# Patient Record
Sex: Female | Born: 1980 | Race: White | Hispanic: No | Smoking: Current every day smoker
Health system: Southern US, Community
[De-identification: ages and names within clinical notes are randomized; demographics above are authoritative.]

## PROBLEM LIST (undated history)

## (undated) DIAGNOSIS — F329 Major depressive disorder, single episode, unspecified: Secondary | ICD-10-CM

## (undated) DIAGNOSIS — R911 Solitary pulmonary nodule: Secondary | ICD-10-CM

## (undated) DIAGNOSIS — F1010 Alcohol abuse, uncomplicated: Secondary | ICD-10-CM

## (undated) DIAGNOSIS — F419 Anxiety disorder, unspecified: Secondary | ICD-10-CM

## (undated) DIAGNOSIS — F32A Depression, unspecified: Secondary | ICD-10-CM

## (undated) DIAGNOSIS — R569 Unspecified convulsions: Secondary | ICD-10-CM

## (undated) DIAGNOSIS — Z8614 Personal history of Methicillin resistant Staphylococcus aureus infection: Secondary | ICD-10-CM

## (undated) DIAGNOSIS — F988 Other specified behavioral and emotional disorders with onset usually occurring in childhood and adolescence: Secondary | ICD-10-CM

## (undated) HISTORY — PX: BUNIONECTOMY: SHX129

## (undated) HISTORY — DX: Unspecified convulsions: R56.9

## (undated) HISTORY — DX: Personal history of Methicillin resistant Staphylococcus aureus infection: Z86.14

## (undated) HISTORY — DX: Major depressive disorder, single episode, unspecified: F32.9

## (undated) HISTORY — DX: Anxiety disorder, unspecified: F41.9

## (undated) HISTORY — DX: Depression, unspecified: F32.A

## (undated) HISTORY — DX: Solitary pulmonary nodule: R91.1

## (undated) HISTORY — DX: Other specified behavioral and emotional disorders with onset usually occurring in childhood and adolescence: F98.8

---
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
## 2000-06-22 ENCOUNTER — Other Ambulatory Visit: Admission: RE | Admit: 2000-06-22 | Discharge: 2000-06-22 | Payer: Self-pay | Admitting: Obstetrics and Gynecology

## 2001-01-04 ENCOUNTER — Inpatient Hospital Stay (HOSPITAL_COMMUNITY): Admission: AD | Admit: 2001-01-04 | Discharge: 2001-01-07 | Payer: Self-pay | Admitting: Obstetrics and Gynecology

## 2001-01-20 ENCOUNTER — Encounter: Admission: RE | Admit: 2001-01-20 | Discharge: 2001-02-19 | Payer: Self-pay | Admitting: Obstetrics and Gynecology

## 2001-03-22 ENCOUNTER — Encounter: Admission: RE | Admit: 2001-03-22 | Discharge: 2001-04-21 | Payer: Self-pay | Admitting: Obstetrics and Gynecology

## 2001-04-22 ENCOUNTER — Encounter: Admission: RE | Admit: 2001-04-22 | Discharge: 2001-05-22 | Payer: Self-pay | Admitting: Obstetrics and Gynecology

## 2001-06-22 ENCOUNTER — Encounter: Admission: RE | Admit: 2001-06-22 | Discharge: 2001-07-22 | Payer: Self-pay | Admitting: Obstetrics and Gynecology

## 2001-07-23 ENCOUNTER — Encounter: Admission: RE | Admit: 2001-07-23 | Discharge: 2001-08-22 | Payer: Self-pay | Admitting: Obstetrics and Gynecology

## 2001-09-22 ENCOUNTER — Encounter: Admission: RE | Admit: 2001-09-22 | Discharge: 2001-10-22 | Payer: Self-pay | Admitting: Obstetrics and Gynecology

## 2001-11-22 ENCOUNTER — Encounter: Admission: RE | Admit: 2001-11-22 | Discharge: 2001-12-22 | Payer: Self-pay | Admitting: Obstetrics and Gynecology

## 2001-12-23 ENCOUNTER — Encounter: Admission: RE | Admit: 2001-12-23 | Discharge: 2002-01-22 | Payer: Self-pay | Admitting: Obstetrics and Gynecology

## 2002-03-23 ENCOUNTER — Encounter: Admission: RE | Admit: 2002-03-23 | Discharge: 2002-03-23 | Payer: Self-pay | Admitting: *Deleted

## 2002-12-02 ENCOUNTER — Emergency Department (HOSPITAL_COMMUNITY): Admission: EM | Admit: 2002-12-02 | Discharge: 2002-12-02 | Payer: Self-pay | Admitting: Emergency Medicine

## 2002-12-02 ENCOUNTER — Encounter: Payer: Self-pay | Admitting: Emergency Medicine

## 2003-01-19 ENCOUNTER — Encounter: Admission: RE | Admit: 2003-01-19 | Discharge: 2003-01-19 | Payer: Self-pay | Admitting: *Deleted

## 2003-12-28 ENCOUNTER — Emergency Department (HOSPITAL_COMMUNITY): Admission: EM | Admit: 2003-12-28 | Discharge: 2003-12-28 | Payer: Self-pay | Admitting: *Deleted

## 2004-01-24 ENCOUNTER — Emergency Department (HOSPITAL_COMMUNITY): Admission: EM | Admit: 2004-01-24 | Discharge: 2004-01-24 | Payer: Self-pay | Admitting: Emergency Medicine

## 2004-06-21 ENCOUNTER — Emergency Department (HOSPITAL_COMMUNITY): Admission: EM | Admit: 2004-06-21 | Discharge: 2004-06-21 | Payer: Self-pay | Admitting: Emergency Medicine

## 2004-09-21 ENCOUNTER — Emergency Department (HOSPITAL_COMMUNITY): Admission: EM | Admit: 2004-09-21 | Discharge: 2004-09-21 | Payer: Self-pay | Admitting: Emergency Medicine

## 2004-10-26 DIAGNOSIS — O009 Unspecified ectopic pregnancy without intrauterine pregnancy: Secondary | ICD-10-CM

## 2004-10-26 HISTORY — DX: Unspecified ectopic pregnancy without intrauterine pregnancy: O00.90

## 2004-11-07 ENCOUNTER — Other Ambulatory Visit: Admission: RE | Admit: 2004-11-07 | Discharge: 2004-11-07 | Payer: Self-pay | Admitting: Obstetrics and Gynecology

## 2004-11-10 ENCOUNTER — Other Ambulatory Visit: Admission: RE | Admit: 2004-11-10 | Discharge: 2004-11-10 | Payer: Self-pay | Admitting: Obstetrics and Gynecology

## 2005-05-09 ENCOUNTER — Emergency Department (HOSPITAL_COMMUNITY): Admission: EM | Admit: 2005-05-09 | Discharge: 2005-05-09 | Payer: Self-pay | Admitting: Emergency Medicine

## 2005-05-27 ENCOUNTER — Emergency Department (HOSPITAL_COMMUNITY): Admission: EM | Admit: 2005-05-27 | Discharge: 2005-05-27 | Payer: Self-pay | Admitting: Emergency Medicine

## 2006-01-24 ENCOUNTER — Emergency Department (HOSPITAL_COMMUNITY): Admission: EM | Admit: 2006-01-24 | Discharge: 2006-01-24 | Payer: Self-pay | Admitting: Emergency Medicine

## 2006-01-31 ENCOUNTER — Inpatient Hospital Stay (HOSPITAL_COMMUNITY): Admission: AD | Admit: 2006-01-31 | Discharge: 2006-01-31 | Payer: Self-pay | Admitting: Obstetrics and Gynecology

## 2006-02-05 ENCOUNTER — Inpatient Hospital Stay (HOSPITAL_COMMUNITY): Admission: AD | Admit: 2006-02-05 | Discharge: 2006-02-05 | Payer: Self-pay | Admitting: Obstetrics and Gynecology

## 2006-02-08 ENCOUNTER — Inpatient Hospital Stay (HOSPITAL_COMMUNITY): Admission: AD | Admit: 2006-02-08 | Discharge: 2006-02-08 | Payer: Self-pay | Admitting: Family Medicine

## 2006-02-24 ENCOUNTER — Encounter: Payer: Self-pay | Admitting: Emergency Medicine

## 2006-03-26 ENCOUNTER — Emergency Department (HOSPITAL_COMMUNITY): Admission: EM | Admit: 2006-03-26 | Discharge: 2006-03-26 | Payer: Self-pay | Admitting: Emergency Medicine

## 2007-02-06 ENCOUNTER — Inpatient Hospital Stay (HOSPITAL_COMMUNITY): Admission: AD | Admit: 2007-02-06 | Discharge: 2007-02-07 | Payer: Self-pay | Admitting: Obstetrics & Gynecology

## 2007-03-01 ENCOUNTER — Inpatient Hospital Stay (HOSPITAL_COMMUNITY): Admission: AD | Admit: 2007-03-01 | Discharge: 2007-03-04 | Payer: Self-pay | Admitting: Obstetrics and Gynecology

## 2007-03-05 ENCOUNTER — Encounter: Admission: RE | Admit: 2007-03-05 | Discharge: 2007-03-31 | Payer: Self-pay | Admitting: Obstetrics and Gynecology

## 2009-09-24 ENCOUNTER — Inpatient Hospital Stay (HOSPITAL_COMMUNITY): Admission: AD | Admit: 2009-09-24 | Discharge: 2009-09-24 | Payer: Self-pay | Admitting: Obstetrics and Gynecology

## 2009-10-06 ENCOUNTER — Inpatient Hospital Stay (HOSPITAL_COMMUNITY): Admission: AD | Admit: 2009-10-06 | Discharge: 2009-10-07 | Payer: Self-pay | Admitting: Obstetrics & Gynecology

## 2009-10-22 ENCOUNTER — Inpatient Hospital Stay (HOSPITAL_COMMUNITY): Admission: AD | Admit: 2009-10-22 | Discharge: 2009-10-22 | Payer: Self-pay | Admitting: Obstetrics and Gynecology

## 2009-11-18 ENCOUNTER — Encounter (INDEPENDENT_AMBULATORY_CARE_PROVIDER_SITE_OTHER): Payer: Self-pay | Admitting: Obstetrics and Gynecology

## 2009-11-18 ENCOUNTER — Ambulatory Visit (HOSPITAL_COMMUNITY): Admission: RE | Admit: 2009-11-18 | Discharge: 2009-11-18 | Payer: Self-pay | Admitting: Obstetrics and Gynecology

## 2009-11-18 ENCOUNTER — Ambulatory Visit: Payer: Self-pay | Admitting: Vascular Surgery

## 2009-11-24 ENCOUNTER — Inpatient Hospital Stay (HOSPITAL_COMMUNITY): Admission: AD | Admit: 2009-11-24 | Discharge: 2009-11-26 | Payer: Self-pay | Admitting: Obstetrics and Gynecology

## 2009-11-27 ENCOUNTER — Encounter: Admission: RE | Admit: 2009-11-27 | Discharge: 2009-12-24 | Payer: Self-pay | Admitting: Obstetrics and Gynecology

## 2009-12-25 ENCOUNTER — Encounter: Admission: RE | Admit: 2009-12-25 | Discharge: 2010-01-24 | Payer: Self-pay | Admitting: Obstetrics and Gynecology

## 2010-01-25 ENCOUNTER — Encounter: Admission: RE | Admit: 2010-01-25 | Discharge: 2010-02-24 | Payer: Self-pay | Admitting: Obstetrics and Gynecology

## 2010-02-25 ENCOUNTER — Encounter: Admission: RE | Admit: 2010-02-25 | Discharge: 2010-03-27 | Payer: Self-pay | Admitting: Obstetrics and Gynecology

## 2010-03-28 ENCOUNTER — Encounter: Admission: RE | Admit: 2010-03-28 | Discharge: 2010-04-27 | Payer: Self-pay | Admitting: Obstetrics and Gynecology

## 2010-04-28 ENCOUNTER — Encounter: Admission: RE | Admit: 2010-04-28 | Discharge: 2010-05-28 | Payer: Self-pay | Admitting: Obstetrics and Gynecology

## 2010-05-29 ENCOUNTER — Encounter: Admission: RE | Admit: 2010-05-29 | Discharge: 2010-06-28 | Payer: Self-pay | Admitting: Obstetrics and Gynecology

## 2010-06-29 ENCOUNTER — Encounter: Admission: RE | Admit: 2010-06-29 | Discharge: 2010-07-22 | Payer: Self-pay | Admitting: Obstetrics and Gynecology

## 2011-01-11 LAB — SYPHILIS: RPR W/REFLEX TO RPR TITER AND TREPONEMAL ANTIBODIES, TRADITIONAL SCREENING AND DIAGNOSIS ALGORITHM: RPR Ser Ql: NONREACTIVE

## 2011-01-11 LAB — CBC
HCT: 33.9 % — ABNORMAL LOW (ref 36.0–46.0)
MCHC: 33.5 g/dL (ref 30.0–36.0)
RBC: 3.33 MIL/uL — ABNORMAL LOW (ref 3.87–5.11)
RDW: 14.5 % (ref 11.5–15.5)
WBC: 10.3 10*3/uL (ref 4.0–10.5)

## 2011-01-26 LAB — COMPREHENSIVE METABOLIC PANEL
Alkaline Phosphatase: 211 U/L — ABNORMAL HIGH (ref 39–117)
Chloride: 106 mEq/L (ref 96–112)
GFR calc Af Amer: >60 mL/min (ref >60)
GFR calc non Af Amer: >60 mL/min (ref >60)
Potassium: 3.8 mEq/L (ref 3.5–5.1)
Total Bilirubin: 0.1 mg/dL — ABNORMAL LOW (ref 0.3–1.2)

## 2011-01-26 LAB — URINALYSIS, ROUTINE W REFLEX MICROSCOPIC
Protein, ur: NEGATIVE mg/dL
Specific Gravity, Urine: <1.005 — ABNORMAL LOW (ref 1.005–1.030)
Urobilinogen, UA: 0.2 mg/dL (ref 0.0–1.0)
pH: 6.5 (ref 5.0–8.0)

## 2011-01-26 LAB — CBC
MCV: 95 fL (ref 78.0–100.0)
Platelets: 330 10*3/uL (ref 150–400)
RBC: 3.52 MIL/uL — ABNORMAL LOW (ref 3.87–5.11)
RDW: 12.6 % (ref 11.5–15.5)
WBC: 11.9 10*3/uL — ABNORMAL HIGH (ref 4.0–10.5)

## 2011-01-27 LAB — URINALYSIS, ROUTINE W REFLEX MICROSCOPIC
Glucose, UA: NEGATIVE mg/dL
Hgb urine dipstick: NEGATIVE
Urobilinogen, UA: 0.2 mg/dL (ref 0.0–1.0)
pH: 5.5 (ref 5.0–8.0)

## 2011-01-27 LAB — FETAL FIBRONECTIN: Fetal Fibronectin: NEGATIVE

## 2011-01-27 LAB — URINE MICROSCOPIC-ADD ON: RBC / HPF: NONE SEEN RBC/hpf (ref <3)

## 2011-01-28 LAB — WET PREP, GENITAL: Trich, Wet Prep: NONE SEEN

## 2012-08-16 ENCOUNTER — Other Ambulatory Visit (HOSPITAL_COMMUNITY): Payer: Self-pay | Admitting: Lactation Services

## 2012-08-16 ENCOUNTER — Other Ambulatory Visit (HOSPITAL_COMMUNITY): Payer: Self-pay | Admitting: Family Medicine

## 2012-08-16 ENCOUNTER — Other Ambulatory Visit (HOSPITAL_COMMUNITY): Payer: Self-pay

## 2012-08-16 ENCOUNTER — Ambulatory Visit (HOSPITAL_COMMUNITY)
Admission: RE | Admit: 2012-08-16 | Discharge: 2012-08-16 | Disposition: A | Discharge status: Home / Self Care | Payer: BC Managed Care – PPO | Source: Ambulatory Visit | Attending: Family Medicine | Admitting: Family Medicine

## 2012-08-16 DIAGNOSIS — R1032 Left lower quadrant pain: Secondary | ICD-10-CM | POA: Insufficient documentation

## 2012-08-16 DIAGNOSIS — R102 Pelvic and perineal pain: Secondary | ICD-10-CM

## 2012-08-16 DIAGNOSIS — R110 Nausea: Secondary | ICD-10-CM

## 2012-08-16 DIAGNOSIS — O209 Hemorrhage in early pregnancy, unspecified: Secondary | ICD-10-CM | POA: Insufficient documentation

## 2012-08-22 ENCOUNTER — Ambulatory Visit
Admission: RE | Admit: 2012-08-22 | Discharge: 2012-08-22 | Disposition: A | Discharge status: Home / Self Care | Payer: BC Managed Care – PPO | Source: Ambulatory Visit | Attending: Family Medicine | Admitting: Family Medicine

## 2012-08-22 ENCOUNTER — Other Ambulatory Visit: Payer: Self-pay | Admitting: Family Medicine

## 2012-08-22 DIAGNOSIS — R1032 Left lower quadrant pain: Secondary | ICD-10-CM

## 2012-08-22 MED ORDER — IOHEXOL 300 MG/ML  SOLN
100.0000 mL | Freq: Once | INTRAMUSCULAR | Status: AC | PRN
  Administered 2012-08-22: 100 mL via INTRAVENOUS

## 2012-09-12 ENCOUNTER — Other Ambulatory Visit: Payer: Self-pay | Admitting: Family Medicine

## 2012-09-12 DIAGNOSIS — R911 Solitary pulmonary nodule: Secondary | ICD-10-CM

## 2012-09-15 ENCOUNTER — Ambulatory Visit
Admission: RE | Admit: 2012-09-15 | Discharge: 2012-09-15 | Disposition: A | Discharge status: Home / Self Care | Payer: BC Managed Care – PPO | Source: Ambulatory Visit | Attending: Family Medicine | Admitting: Family Medicine

## 2012-09-15 DIAGNOSIS — R911 Solitary pulmonary nodule: Secondary | ICD-10-CM

## 2012-09-19 DIAGNOSIS — R911 Solitary pulmonary nodule: Secondary | ICD-10-CM | POA: Insufficient documentation

## 2012-09-19 DIAGNOSIS — F988 Other specified behavioral and emotional disorders with onset usually occurring in childhood and adolescence: Secondary | ICD-10-CM | POA: Insufficient documentation

## 2012-09-19 DIAGNOSIS — F419 Anxiety disorder, unspecified: Secondary | ICD-10-CM

## 2012-09-19 DIAGNOSIS — F329 Major depressive disorder, single episode, unspecified: Secondary | ICD-10-CM | POA: Insufficient documentation

## 2012-09-19 DIAGNOSIS — F32A Depression, unspecified: Secondary | ICD-10-CM | POA: Insufficient documentation

## 2012-09-20 ENCOUNTER — Institutional Professional Consult (permissible substitution) (INDEPENDENT_AMBULATORY_CARE_PROVIDER_SITE_OTHER): Payer: BC Managed Care – PPO | Admitting: Thoracic Surgery (Cardiothoracic Vascular Surgery)

## 2012-09-20 ENCOUNTER — Encounter: Payer: Self-pay | Admitting: Thoracic Surgery (Cardiothoracic Vascular Surgery)

## 2012-09-20 VITALS — BP 130/80 | HR 102 | Resp 18 | Ht 64.0 in | Wt 135.0 lb

## 2012-09-20 DIAGNOSIS — R911 Solitary pulmonary nodule: Secondary | ICD-10-CM

## 2012-09-20 NOTE — Progress Notes (Signed)
PCP is Laurell Josephs, [place] Referring Provider is Carilyn Goodpasture, [place]  Chief Complaint  Patient presents with  . Lung Lesion    Surgical eval on Pulmonary nodules Chest CT Super D 09/16/12    HPI: 31 year old presents with chief complaint of a lung nodule  Mrs. Connie Reed is a 31 year old woman who was being evaluated for abdominal pain in October. A CT of the abdomen and pelvis revealed a small right middle lobe nodule. This was followed by a CT of the chest on November 21 which showed a 5 mm nodule in the right middle lobe and multiple other scattered subcentimeter nodules throughout both lungs. There was no hilar or mediastinal adenopathy.  She does continue to have some flank pain. She also says she periodically has pressure in her chest that she thinks is do to anxiety or panic attacks. She does not have a cough, hemoptysis, wheezing, or shortness of breath. She says she does feel tired all the time intravenous that to having 3 children. She does not have any known exposures to TB or fungus. She's not had any exposure to birds. She did travel to [place] in October.   Past Medical History  Diagnosis Date  . Pulmonary nodule   . ADD (attention deficit disorder)   . Anxiety and depression   . Hx MRSA infection     Past Surgical History  Procedure Date  . Bunionectomy     2009    Family History  Problem Relation Age of Onset  . Hypertension Mother   . Hyperlipidemia Father   . Hyperlipidemia Sister   . Hypertension Sister   . Diabetes Sister   . Kidney disease Maternal Grandmother   . Stroke Maternal Grandmother   . Breast cancer Maternal Grandmother   . Heart disease Maternal Grandfather   . Hyperlipidemia Maternal Grandfather   . Hypertension Maternal Grandfather     Social History History  Substance Use Topics  . Smoking status: Current Every Day Smoker -- 0.5 packs/day for 10 years    Types: Cigarettes  . Smokeless tobacco: Not on file  . Alcohol Use: No     Current Outpatient Prescriptions  Medication Sig Dispense Refill  . acetaminophen (TYLENOL) 500 MG tablet Take 500 mg by mouth every 6 (six) hours as needed.      . ALPRAZolam (XANAX) 1 MG tablet Take 1 mg by mouth at bedtime as needed.      Marland Kitchen amphetamine-dextroamphetamine (ADDERALL) 30 MG tablet Take 30 mg by mouth daily.      Marland Kitchen HYDROcodone-acetaminophen (NORCO/VICODIN) 5-325 MG per tablet Take 1 tablet by mouth every 6 (six) hours as needed.      Marland Kitchen ibuprofen (ADVIL,MOTRIN) 200 MG tablet Take 200 mg by mouth every 6 (six) hours as needed.      . ondansetron (ZOFRAN) 8 MG tablet Take by mouth every 8 (eight) hours as needed.        Allergies  Allergen Reactions  . Doxycycline Hyclate Other (See Comments)    Feels strange  . Phenergan (Promethazine Hcl) Other (See Comments)    Does not work     Review of Systems  Constitutional: Positive for appetite change and unexpected weight change.  Respiratory: Positive for chest tightness.   Cardiovascular: Positive for leg swelling.  Gastrointestinal:       Abdominal and flank pain  Genitourinary:       ? Kidney stone  Neurological: Positive for numbness.  Psychiatric/Behavioral:       Anxiety,  nervousness  All other systems reviewed and are negative.    BP 130/80  Pulse 102  Resp 18  Ht 5\' 4"  (1.626 m)  Wt 135 lb (61.236 kg)  BMI 23.17 kg/m2  SpO2 99%  LMP 09/13/2012 Physical Exam  Constitutional: She is oriented to person, place, and time. She appears well-developed and well-nourished. No distress.  HENT:  Head: Normocephalic and atraumatic.  Eyes: EOM are normal. Pupils are equal, round, and reactive to light.  Neck: Neck supple. No thyromegaly present.  Cardiovascular: Normal rate, regular rhythm, normal heart sounds and intact distal pulses.  Exam reveals no gallop and no friction rub.   No murmur heard. Pulmonary/Chest: Breath sounds normal. She has no wheezes. She has no rales.  Abdominal: Soft. There is no  tenderness.  Musculoskeletal: She exhibits no edema.  Lymphadenopathy:    She has no cervical adenopathy.  Neurological: She is alert and oriented to person, place, and time. No cranial nerve deficit.  Skin: Skin is dry.  Psychiatric: She has a normal mood and affect.     Diagnostic Tests: CT of chest 09/15/2012 CT CHEST WITHOUT CONTRAST  Technique: Multidetector CT imaging of the chest was performed  following the standard protocol without IV contrast.  Comparison: Abdominal CT scan 08/22/2012.  Findings: The chest wall is unremarkable. No breast masses,  supraclavicular or axillary adenopathy. Small scattered lymph  nodes are noted.  The heart is normal in size. No mediastinal or hilar  lymphadenopathy. There is residual thymic tissue noted in the  anterior mediastinum. The esophagus is grossly normal. The aorta  is normal in caliber.  Examination of the lung parenchyma demonstrates small scattered  pulmonary nodules. There are sub 4 mm nodules in both lungs and a  a smoothly marginated well-circumscribed 5 mm nodule in the right  middle lobe. No worrisome mass lesions and no acute pulmonary  findings. No findings of bronchiectasis or interstitial lung  disease. No pleural effusion.  The upper abdomen is unremarkable.  IMPRESSION:  1. Tiny scattered pulmonary nodules and a 5 mm nodule in the right  middle lobe. These are likely benign given their size, appearance  and the patient's age. I would recommend a follow-up noncontrast  chest CT in 6 months to document stability.  2. No acute pulmonary findings.  3. No mediastinal or hilar lymphadenopathy.   Impression: 31 year old woman with multiple lung nodules, the largest being a 5 mm nodule in the right middle lobe. These are most likely benign, incidental findings.   I reviewed the CT scan with the patient and her husband. We discussed at length the issues regarding these nodules. Although she is low risk for lung cancer, I  did counsel her to quit smoking. She did travel to [place] in October which is an area relatively endemic for histoplasmosis, although I doubt that is the etiology of the nodules.  My recommendation is to do a repeat CT in 6 months. This is in agreement with the Fleischner criteria. I think we can safely follow this with low-dose CT to minimize her radiation exposure.  Plan: Return in 6 months with low dose CT scan of chest to followup lung nodules.

## 2013-03-06 ENCOUNTER — Other Ambulatory Visit: Payer: Self-pay

## 2013-03-06 DIAGNOSIS — D381 Neoplasm of uncertain behavior of trachea, bronchus and lung: Secondary | ICD-10-CM

## 2013-03-28 ENCOUNTER — Inpatient Hospital Stay: Admission: RE | Admit: 2013-03-28 | Payer: BC Managed Care – PPO | Source: Ambulatory Visit

## 2013-03-28 ENCOUNTER — Ambulatory Visit: Payer: BC Managed Care – PPO | Admitting: Thoracic Surgery (Cardiothoracic Vascular Surgery)

## 2013-12-19 IMAGING — CT CT CHEST W/O CM
3 of 4 series · 16 of 30 positions shown, 17 images · non-contrast
Comparison: Abdominal CT scan 08/22/2012.

CLINICAL DATA: Pulmonary nodules seen on recent abdominal CT scan.

CT CHEST WITHOUT CONTRAST
TECHNIQUE: Multidetector CT imaging of the chest was performed
following the standard protocol without IV contrast.

[Series 3: chest w/o · axial · non-contrast · 0.78mm/px · z∈[-222,-42]mm · 4 of 61 slices shown, 5 images]
[im 13/61  mediastinal]
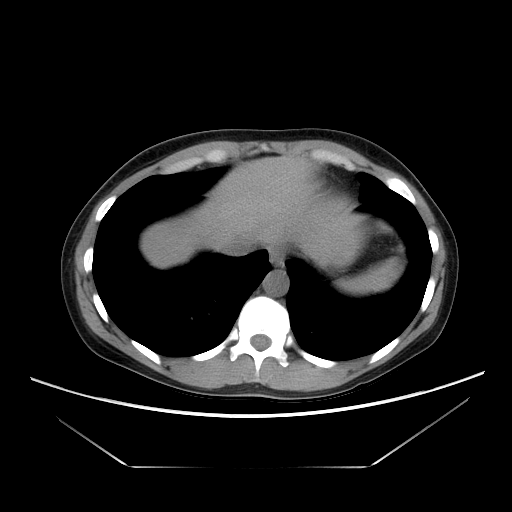
[im 13/61  lung]
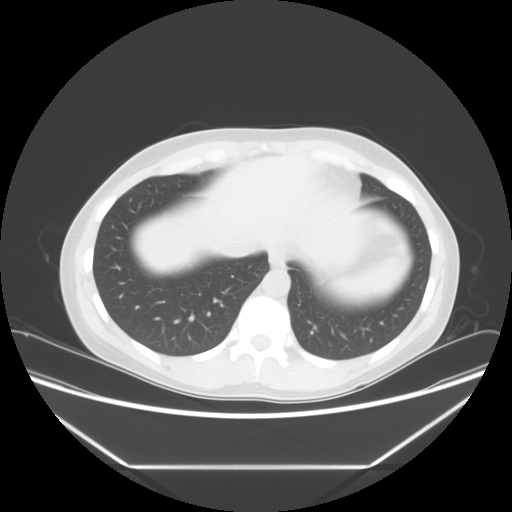
[im 25/61  lung]
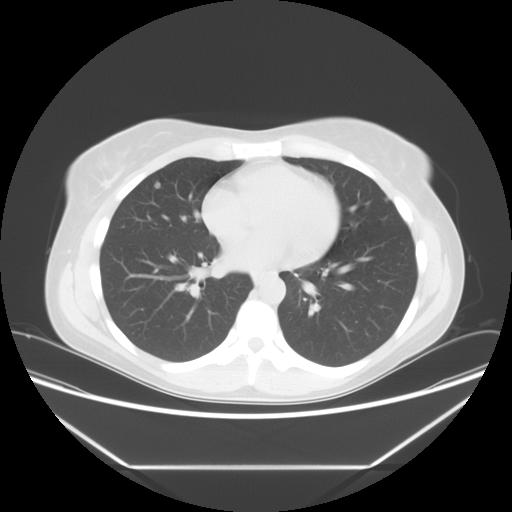
[im 37/61  lung]
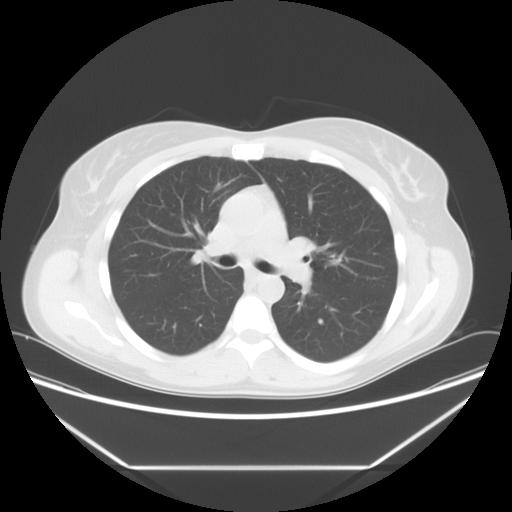
[im 49/61  lung]
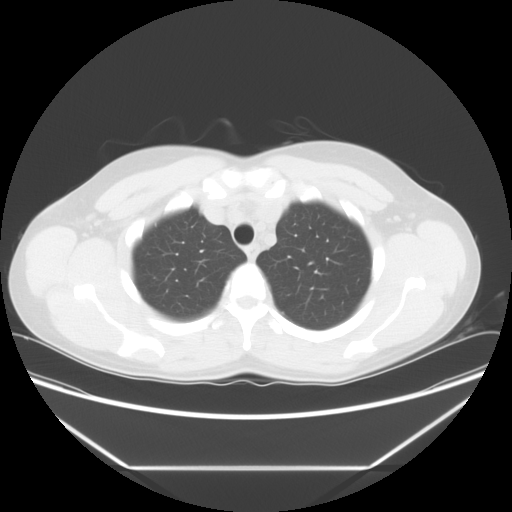

[Series 4: lung windows · axial · 0.78mm/px · z∈[-217,-42]mm · 4 of 59 slices shown]
[im 12/59  lung]
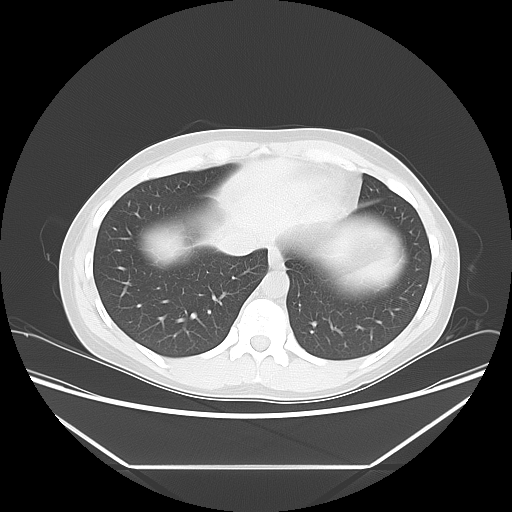
[im 24/59  lung]
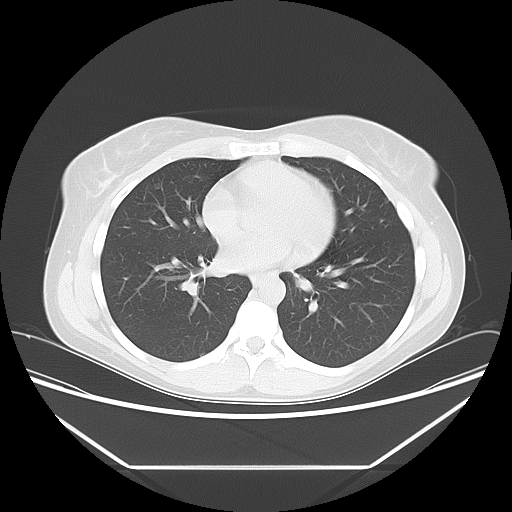
[im 35/59  lung]
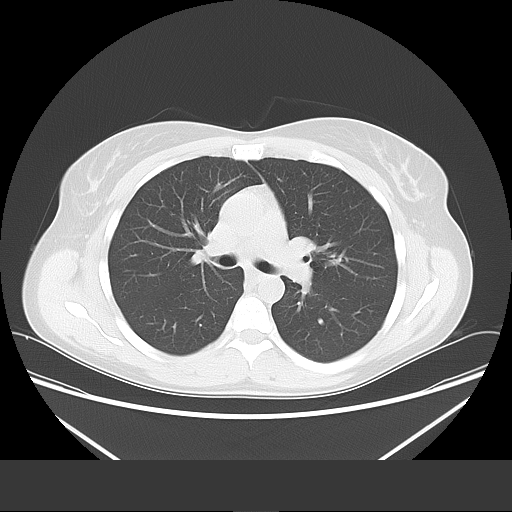
[im 47/59  lung]
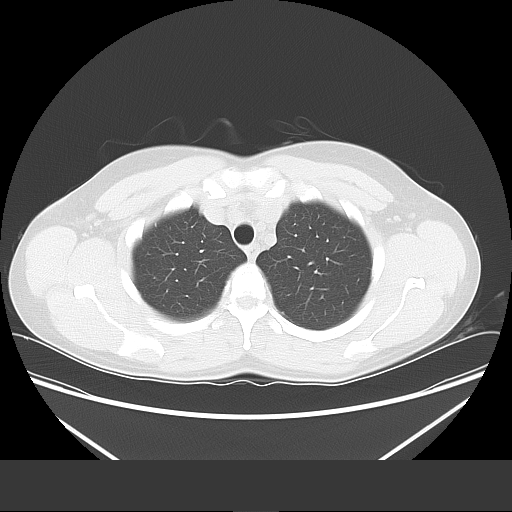

[Series 602: sagittal body · sagittal · 0.78mm/px · 8 of 155 slices shown]
[im 11/155  mediastinal]
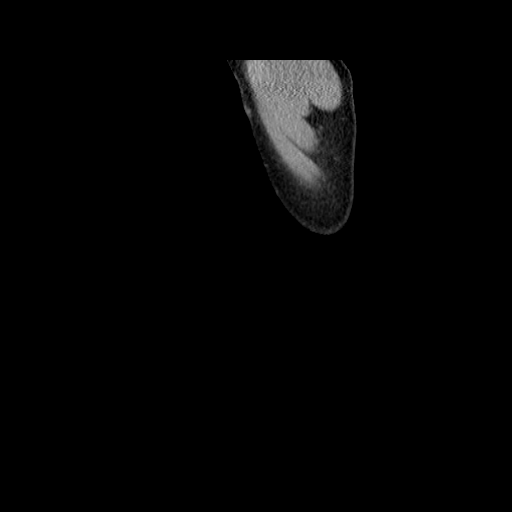
[im 31/155  mediastinal]
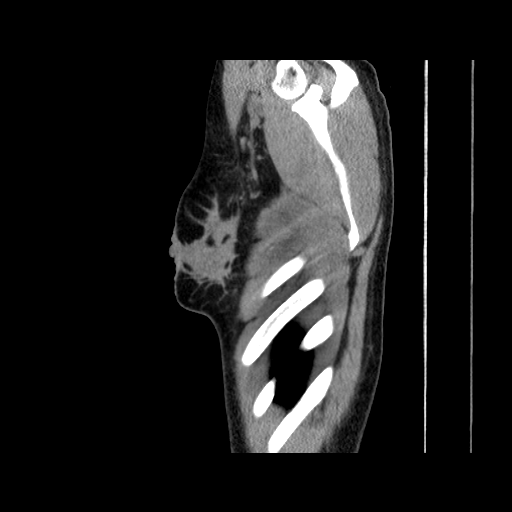
[im 52/155  mediastinal]
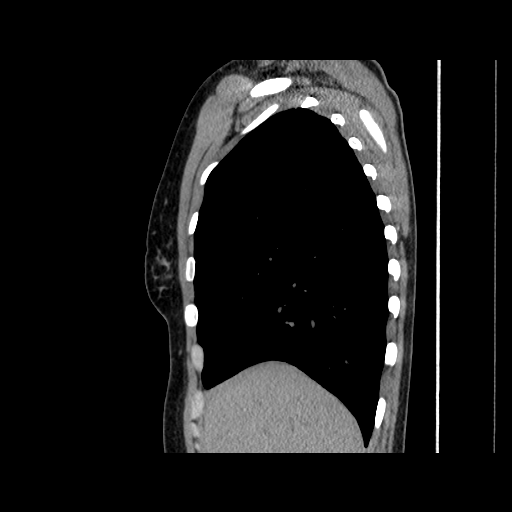
[im 72/155  mediastinal]
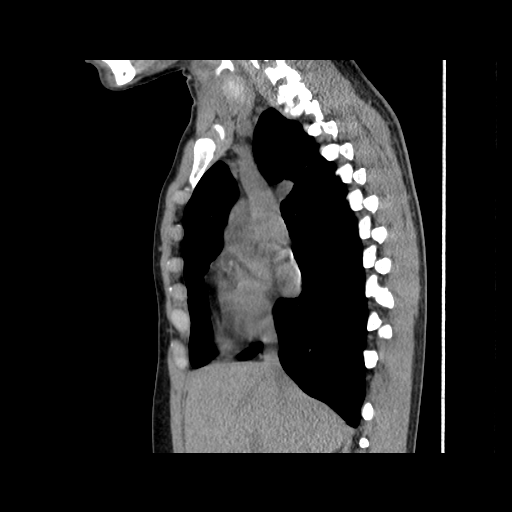
[im 83/155  mediastinal]
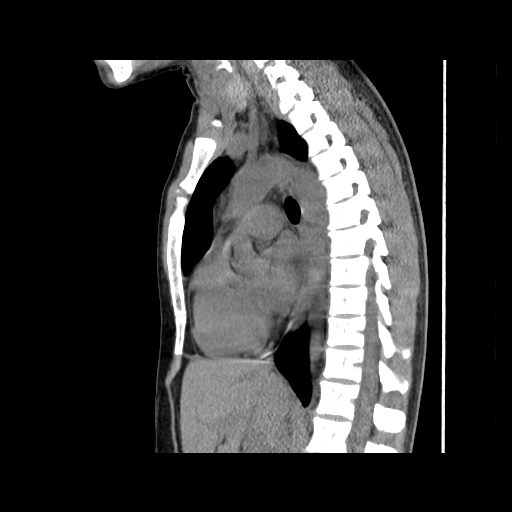
[im 103/155  mediastinal]
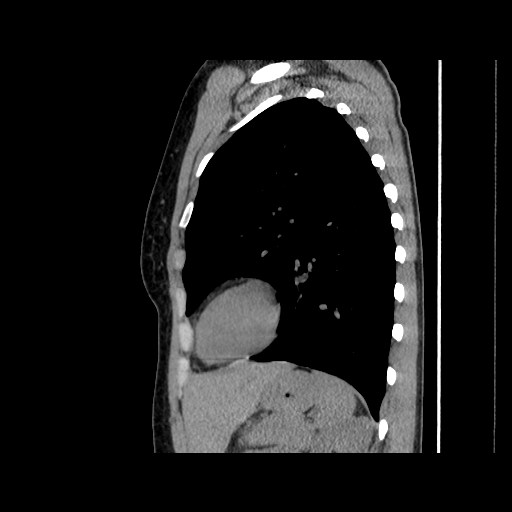
[im 124/155  mediastinal]
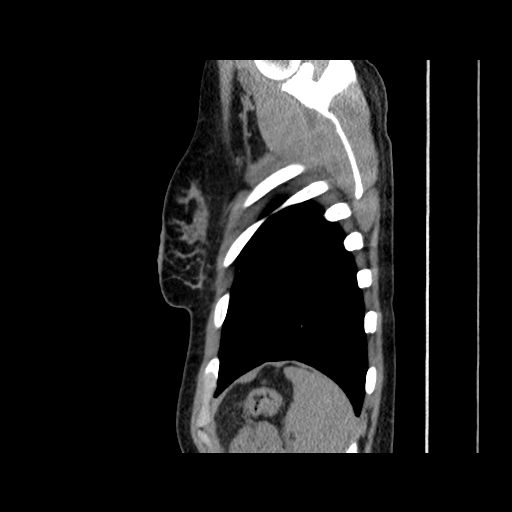
[im 144/155  mediastinal]
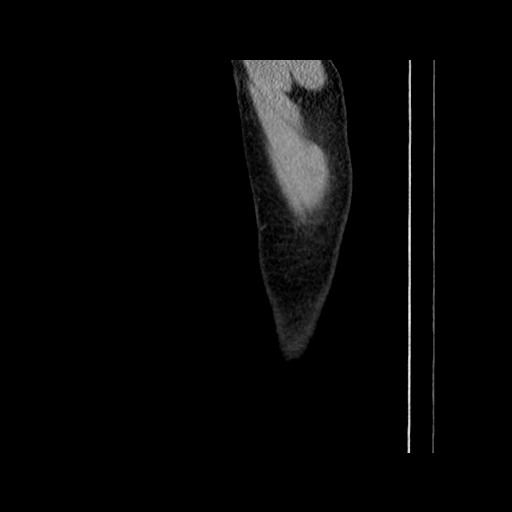

[16 of 30 positions shown; findings below may reference images not displayed]

FINDINGS: The chest wall is unremarkable.  No breast masses,
supraclavicular or axillary adenopathy.  Small scattered lymph
nodes are noted.

The heart is normal in size.  No mediastinal or hilar
lymphadenopathy.  There is residual thymic tissue noted in the
anterior mediastinum.  The esophagus is grossly normal.  The aorta
is normal in caliber.

Examination of the lung parenchyma demonstrates small scattered
pulmonary nodules.  There are sub 4 mm nodules in both lungs and a
a smoothly marginated well-circumscribed 5 mm nodule in the right
middle lobe.  No worrisome mass lesions and no acute pulmonary
findings.  No findings of bronchiectasis or interstitial lung
disease.  No pleural effusion.

The upper abdomen is unremarkable.
IMPRESSION: 1.  Tiny scattered pulmonary nodules and a 5 mm nodule in the right
middle lobe.  These are likely benign given their size, appearance
and the patient's age.  I would recommend a follow-up noncontrast
chest CT in 6 months to document stability.
2.  No acute pulmonary findings.
3.  No mediastinal or hilar lymphadenopathy.

## 2014-07-13 ENCOUNTER — Ambulatory Visit (INDEPENDENT_AMBULATORY_CARE_PROVIDER_SITE_OTHER): Payer: BC Managed Care – PPO | Admitting: Nurse Practitioner

## 2014-07-13 ENCOUNTER — Encounter: Payer: Self-pay | Admitting: Nurse Practitioner

## 2014-07-13 VITALS — BP 108/64 | HR 80 | Ht 64.0 in | Wt 147.0 lb

## 2014-07-13 DIAGNOSIS — N912 Amenorrhea, unspecified: Secondary | ICD-10-CM

## 2014-07-13 DIAGNOSIS — N9489 Other specified conditions associated with female genital organs and menstrual cycle: Secondary | ICD-10-CM

## 2014-07-13 DIAGNOSIS — Z01419 Encounter for gynecological examination (general) (routine) without abnormal findings: Secondary | ICD-10-CM

## 2014-07-13 DIAGNOSIS — Z0000 Encounter for general adult medical examination without abnormal findings: Secondary | ICD-10-CM

## 2014-07-13 LAB — POCT URINALYSIS DIPSTICK
Bilirubin, UA: NEGATIVE
Glucose, UA: NEGATIVE
KETONES UA: NEGATIVE
Leukocytes, UA: NEGATIVE
Nitrite, UA: NEGATIVE
PROTEIN UA: NEGATIVE
RBC UA: NEGATIVE
UROBILINOGEN UA: NEGATIVE
pH, UA: 5

## 2014-07-13 LAB — HEMOGLOBIN, FINGERSTICK: HEMOGLOBIN, FINGERSTICK: 13.5 g/dL (ref 12.0–16.0)

## 2014-07-13 LAB — POCT URINE PREGNANCY: PREG TEST UR: NEGATIVE

## 2014-07-13 MED ORDER — DESOGEST-ETH ESTRAD TRIPHASIC 0.1/0.125/0.15 -0.025 MG PO TABS: 1.0000 | ORAL_TABLET | Freq: Every day | ORAL | Status: DC

## 2014-07-13 NOTE — Patient Instructions (Signed)

## 2014-07-13 NOTE — Progress Notes (Signed)
Patient ID: Connie Reed, female   DOB: May 09, 1981, 33 y.o.   MRN: [id number] 33 y.o. [id number] Married Caucasian Fe here for annual exam.  Menses normal is 4-5 days. regular on OCP.  Then in August while on the pill took a home UPT and was positive.  2 days later because of missed pill she bled for 2 days. Several UPT then were negative.  She then finished pack of pills first week in September and had a day of bad lower pelvic pain.  She did have a light flow for a day.  States she has had OV cyst in the past and pain was similar.  No pain since then.  She very readily admits that she is not compliant to taking OCP. In the past on 1 occasion was scheduled for a BTL and had cellulitis from an orthopedic problem and surgery was cancelled.  She also has had a Mirena IUD secondary to menorrhagia and IUD came out with a heavy flow.   Married and same partner for 10 years. He is the father of her 2 youngest children.  She is a poor historian.  No GYN appointment for 4 years since seeing Dr. Horald Pollen.  PCP is giving her OCP and encouraging her to get care here.    Patient's last menstrual period was 06/28/2014.          Sexually active: Yes.    The current method of family planning is OCP (estrogen/progesterone).    Exercising: No.  The patient does not participate in regular exercise at present. Smoker:  yes  Health Maintenance: Pap:  4 years ago, normal TDaP:  ? Labs: HB:  13.5  Urine:  Negative    reports that she has been smoking Cigarettes.  She has a 5 pack-year smoking history. She has never used smokeless tobacco. She reports that she drinks about 1.2 ounces of alcohol per week. She reports that she does not use illicit drugs.  Past Medical History  Diagnosis Date  . Pulmonary nodule   . ADD (attention deficit disorder)   . Anxiety and depression   . Hx MRSA infection     Past Surgical History  Procedure Laterality Date  . Bunionectomy      2009    Current Outpatient Prescriptions   Medication Sig Dispense Refill  . acetaminophen (TYLENOL) 500 MG tablet Take 500 mg by mouth every 6 (six) hours as needed.      . ALPRAZolam (XANAX) 1 MG tablet Take 1 mg by mouth at bedtime as needed.      Marland Kitchen amphetamine-dextroamphetamine (ADDERALL) 30 MG tablet Take 30 mg by mouth daily.      Marland Kitchen desogestrel-ethinyl estradiol (VELIVET) 0.1/0.125/0.15 -0.025 MG tablet Take 1 tablet by mouth daily.  1 Package  0  . ibuprofen (ADVIL,MOTRIN) 200 MG tablet Take 200 mg by mouth every 6 (six) hours as needed.       No current facility-administered medications for this visit.    Family History  Problem Relation Age of Onset  . Hypertension Mother   . Hyperlipidemia Father   . Hyperlipidemia Sister   . Hypertension Sister   . Diabetes Sister   . Kidney disease Maternal Grandmother   . Stroke Maternal Grandmother   . Breast cancer Maternal Grandmother   . Heart disease Maternal Grandfather   . Hyperlipidemia Maternal Grandfather   . Hypertension Maternal Grandfather     ROS:  Pertinent items are noted in HPI.  Otherwise, a comprehensive ROS was  negative.  Exam:   BP 108/64  Pulse 80  Ht 5\' 4"  (1.626 m)  Wt 147 lb (66.679 kg)  BMI 25.22 kg/m2  LMP 06/28/2014 Height: 5\' 4"  (162.6 cm)  Ht Readings from Last 3 Encounters:  07/13/14 5\' 4"  (1.626 m)  09/20/12 5\' 4"  (1.626 m)    General appearance: alert, cooperative and appears stated age Head: Normocephalic, without obvious abnormality, atraumatic Neck: no adenopathy, supple, symmetrical, trachea midline and thyroid normal to inspection and palpation Lungs: clear to auscultation bilaterally Breasts: normal appearance, no masses or tenderness Heart: regular rate and rhythm Abdomen: soft, non-tender; no masses,  no organomegaly, no pain on palpation Extremities: extremities normal, atraumatic, no cyanosis or edema Skin: Skin color, texture, turgor normal. No rashes or lesions Lymph nodes: Cervical, supraclavicular, and axillary nodes  normal. No abnormal inguinal nodes palpated Neurologic: Grossly normal   Pelvic: External genitalia:  no lesions              Urethra:  normal appearing urethra with no masses, tenderness or lesions              Bartholin's and Skene's: normal                 Vagina: normal appearing vagina with normal color and discharge, no lesions              Cervix: anteverted              Pap taken: Yes.   Bimanual Exam:  Uterus:  normal size, contour, position, consistency, mobility, non-tender              Adnexa: no mass, fullness, tenderness               Rectovaginal: Confirms               Anus:  normal sphincter tone, no lesions  A:  Well Woman with normal exam  OCP for contraception  Recent BTB and lower pelvic pain  P:   Reviewed health and wellness pertinent to exam  Pap smear taken today  Will schedule PUS and follow  Will get serum HCG and follow  Counseled on breast self exam, family planning choices, adequate intake of calcium and vitamin D, diet and exercise return annually or prn  An After Visit Summary was printed and given to the patient.

## 2014-07-14 LAB — HCG, SERUM, QUALITATIVE: PREG SERUM: NEGATIVE

## 2014-07-15 NOTE — Progress Notes (Signed)
Encounter reviewed by Dr. Brook Silva.  

## 2014-07-16 ENCOUNTER — Telehealth: Payer: Self-pay | Admitting: *Deleted

## 2014-07-16 NOTE — Telephone Encounter (Signed)
Message copied by Graylon Good on Mon Jul 16, 2014 10:43 AM ------      Message from: Kem Boroughs R      Created: Sun Jul 15, 2014  4:24 PM       Let patient know that serum pregnancy test is also negative.  We will proceed with PUS as discussed. ------

## 2014-07-16 NOTE — Telephone Encounter (Signed)
I have attempted to contact this patient by phone with the following results: left message to return call to [place] at [phone] on answering machine (mobile per [facility]).  [phone] (Mobile)  No personal information given.

## 2014-07-17 LAB — IPS PAP TEST WITH HPV

## 2014-07-17 NOTE — Telephone Encounter (Signed)
Pt notified in result note.  

## 2014-07-17 NOTE — Telephone Encounter (Signed)
Returning a call to Stephanie. °

## 2014-07-18 ENCOUNTER — Telehealth: Payer: Self-pay | Admitting: Nurse Practitioner

## 2014-07-18 NOTE — Telephone Encounter (Signed)
Spoke with patient. Patient states that she has been having abdominal pain since Saturday. Patient has been taking ibuprofen and alternating with tylenol. Pain is in left lower quadrant and wakes her up at night. States that pain is constant. No recent change in bowel habits. Last took 800mg  of ibuprofen at 8am which provides little relief. Pain is about an 8/10 currently. Patient has three children out of school today and declines an appointment. Patient was seen last week and was recommended to have an ultrasound. Patient would like to schedule ultrasound for tomorrow. PUS scheduled for tomorrow at 10:30am with 11am consult with Dr.Silva. Patient agreeable to date and time. Advised of importance of being seen for evaluation if pain is that high. Patient still declines appointment. Advised to call back if pain continues or worsens as she will need to be seen and possibly have [facility]. If pain worsens or continues over night will need to be seen at [facility].Patient is agreeable. Advised will be called with OOP cost for ultrasound before appointment. Patient agreeable.   Routing to Dr.Silva as covering. Anything further for patient? Cc: Felipa Emory for precert

## 2014-07-18 NOTE — Telephone Encounter (Signed)
I agree with your recommendations.  I will be happy to see the patient for her ultrasound visit tomorrow.

## 2014-07-18 NOTE — Telephone Encounter (Signed)
Patient is having severe abdominal pain

## 2014-07-18 NOTE — Telephone Encounter (Signed)
PR $271.14

## 2014-07-18 NOTE — Telephone Encounter (Signed)
Left message for patient to call back. Need to go over benefits for PUS °

## 2014-07-18 NOTE — Telephone Encounter (Signed)
Spoke with patient. Advised that per benefit quote received, she will be responsible to pay $271.14 when she comes in for tomorrows appt. Patient agreeable.

## 2014-07-19 ENCOUNTER — Encounter: Payer: Self-pay | Admitting: Obstetrics and Gynecology

## 2014-07-19 ENCOUNTER — Ambulatory Visit (INDEPENDENT_AMBULATORY_CARE_PROVIDER_SITE_OTHER): Payer: BC Managed Care – PPO

## 2014-07-19 ENCOUNTER — Ambulatory Visit (INDEPENDENT_AMBULATORY_CARE_PROVIDER_SITE_OTHER): Payer: BC Managed Care – PPO | Admitting: Obstetrics and Gynecology

## 2014-07-19 VITALS — BP 122/70 | HR 78 | Wt 147.0 lb

## 2014-07-19 DIAGNOSIS — Z309 Encounter for contraceptive management, unspecified: Secondary | ICD-10-CM

## 2014-07-19 DIAGNOSIS — N912 Amenorrhea, unspecified: Secondary | ICD-10-CM

## 2014-07-19 DIAGNOSIS — N921 Excessive and frequent menstruation with irregular cycle: Secondary | ICD-10-CM

## 2014-07-19 DIAGNOSIS — N920 Excessive and frequent menstruation with regular cycle: Secondary | ICD-10-CM

## 2014-07-19 DIAGNOSIS — M25559 Pain in unspecified hip: Secondary | ICD-10-CM

## 2014-07-19 NOTE — Addendum Note (Signed)
Addended by: Michele Mcalpine on: 07/19/2014 08:27 AM   Modules accepted: Orders

## 2014-07-19 NOTE — Patient Instructions (Signed)
We will call you with your insurance approval for your Mirena IUD.  I will insert it with your next menstrual cycle.

## 2014-07-19 NOTE — Progress Notes (Signed)
GYNECOLOGY  VISIT   HPI: 33 y.o.   Married  Caucasian  female   [phone] with Patient's last menstrual period was 06/28/2014.   here for  Break through bleeding and lower abdominal pain.   Menses every 4 weeks.  On OCPs - Velivet for 6 months.  Menses heavy for first 2 days.  Pelvic pain for the last 2 years.  Can occur when in her premenstrual period.  Taking ibuprofen and tylenol for pain.   Had a positive pregnancy test in August that then became negative. Qualititative HCG in office here negative on 07/13/14. Recent pap showed some endometrial cells but no dysplasia and negative HR HPV.   Had a Mirena IUD in the past and it was expelled.   History of ectopic pregnancy.   GYNECOLOGIC HISTORY: Patient's last menstrual period was 06/28/2014. Contraception:    Menopausal hormone therapy:         OB History   Grav Para Term Preterm Abortions TAB SAB Ect Mult Living   6 3 3  3  1 1  3          Patient Active Problem List   Diagnosis Date Noted  . Pulmonary nodule   . ADD (attention deficit disorder)   . Anxiety and depression     Past Medical History  Diagnosis Date  . Pulmonary nodule   . ADD (attention deficit disorder)   . Anxiety and depression   . Hx MRSA infection     Past Surgical History  Procedure Laterality Date  . Bunionectomy      2009    Current Outpatient Prescriptions  Medication Sig Dispense Refill  . acetaminophen (TYLENOL) 500 MG tablet Take 500 mg by mouth every 6 (six) hours as needed.      . ALPRAZolam (XANAX) 1 MG tablet Take 1 mg by mouth at bedtime as needed.      Marland Kitchen amphetamine-dextroamphetamine (ADDERALL) 30 MG tablet Take 30 mg by mouth daily.      Marland Kitchen desogestrel-ethinyl estradiol (VELIVET) 0.1/0.125/0.15 -0.025 MG tablet Take 1 tablet by mouth daily.  1 Package  0  . ibuprofen (ADVIL,MOTRIN) 200 MG tablet Take 200 mg by mouth every 6 (six) hours as needed.       No current facility-administered medications for this visit.      ALLERGIES: Doxycycline hyclate and Phenergan  Family History  Problem Relation Age of Onset  . Hypertension Mother   . Hyperlipidemia Father   . Hyperlipidemia Sister   . Hypertension Sister   . Diabetes Sister   . Kidney disease Maternal Grandmother   . Stroke Maternal Grandmother   . Breast cancer Maternal Grandmother   . Heart disease Maternal Grandfather   . Hyperlipidemia Maternal Grandfather   . Hypertension Maternal Grandfather     History   Social History  . Marital Status: Married    Spouse Name: N/A    Number of Children: 3  . Years of Education: N/A   Occupational History  . homemaker    Social History Main Topics  . Smoking status: Current Every Day Smoker -- 0.50 packs/day for 10 years    Types: Cigarettes  . Smokeless tobacco: Never Used  . Alcohol Use: 1.2 oz/week    2 Glasses of wine per week  . Drug Use: No  . Sexual Activity: Yes    Partners: Male    Birth Control/ Protection: Pill   Other Topics Concern  . Not on file   Social History Narrative  .  No narrative on file    ROS:  Pertinent items are noted in HPI.  PHYSICAL EXAMINATION:    BP 122/70  Pulse 78  Wt 147 lb (66.679 kg)  LMP 06/28/2014     General appearance: alert, cooperative and appears stated age  Ultrasound - see images and report below - reviewed with patient.  Uterus normal and EMS 5.8 mm.  Normal ovaries.  Right ovary with 15 mm follicle. No free fluid.  ASSESSMENT  Menorrhagia. Irregular bleeding.  Pelvic pain.  Smoker age 36 yo.   PLAN   I discussed options for care including combined contraception until age 6, progesterone only OCPs, Mirena IUD, endometrial ablation, and hysterectomy.  I favor the Mirena IUD and discussed the benefits and risks for the patient.  Patient agrees to proceeding.  Written information to patient about Mirena.  We will plan for a 4 - 5 week IUD check with an ultrasound to look at positioning.   Patient understands that she  could expel another IUD if one is placed.   An After Visit Summary was printed and given to the patient.  _15_____ minutes face to face time of which over 50% was spent in counseling.

## 2014-07-26 ENCOUNTER — Telehealth: Payer: Self-pay | Admitting: Nurse Practitioner

## 2014-07-26 NOTE — Telephone Encounter (Signed)
Spoke with patient. Advised that per benefit quote received, she will be responsible to pay $148.73 when she comes in for IUD. Patient agreeable. Patient is to call within the first 5 days of her cycle to schedule the insertion.

## 2014-08-17 ENCOUNTER — Other Ambulatory Visit: Payer: Self-pay | Admitting: Nurse Practitioner

## 2014-08-17 NOTE — Telephone Encounter (Signed)
Last AEX and refill 07/13/14 #1pack/0R Last Contraceptive Management appt 07/19/14 - IUD placement with next menstrual cycle.  - Please advise.

## 2014-08-27 ENCOUNTER — Encounter: Payer: Self-pay | Admitting: Obstetrics and Gynecology

## 2014-09-18 ENCOUNTER — Telehealth: Payer: Self-pay | Admitting: Obstetrics and Gynecology

## 2014-09-18 NOTE — Telephone Encounter (Signed)
Return call to patient. She received letter from [facility] lab regarding pap but states letter also states to contact MD office. Advised that this is just to advise her that they cannot make final determination of results so they refer her back to [place]. Advised pap result is normal. Due for annual exam in one year. Patient is planning to have IUD insertion. Advised she will need to call with menses as this is scheduled based on cycle, usually first 5-7 days depending on flow.  Routing to provider for final review. Patient agreeable to disposition. Will close encounter  Routing to Dr Sabra Heck while Dr Quincy Simmonds out of office.

## 2014-09-18 NOTE — Telephone Encounter (Signed)
Pt wants to talk with the nurse about some results she received in the mail.

## 2014-10-02 ENCOUNTER — Telehealth: Payer: Self-pay | Admitting: Obstetrics and Gynecology

## 2014-10-02 NOTE — Telephone Encounter (Signed)
Patient is having back and abdominal pain. Patient  is hoping she could get an appointment tomorrow with Dr.Silva.

## 2014-10-02 NOTE — Telephone Encounter (Signed)
Spoke with patient. Patient states that she began to have lower back pain yesterday that has moved into her lower abdomin since last night. Pain is intermittent and sharp. Pain increases with walking and sitting. Denies any urinary symptoms and fever. Has had slight nausea for 2 days. Has been using heating pad and taking aleve for relief. Next menses due to start in 12 days. States LMP was "heavier than normal." Patient requesting to come in to see Dr.Silva tomorrow morning. Appointment scheduled for tomorrow at 8:45am with Dr.Silva. Patient is agreeable to date and time. Would like to know if she can bring her 38 year old daughter to appointment. Advised per office protocol for her safety as well as her child's we do not allow children at appointments. Patient is agreeable and will have someone watch her daughter for her appointment. Will seek earlier care if symptoms worsen.  Routing to provider for final review. Patient agreeable to disposition. Will close encounter

## 2014-10-03 ENCOUNTER — Ambulatory Visit: Payer: BC Managed Care – PPO | Admitting: Obstetrics and Gynecology

## 2014-10-03 ENCOUNTER — Telehealth: Payer: Self-pay | Admitting: Obstetrics and Gynecology

## 2014-10-03 NOTE — Telephone Encounter (Signed)
Pt had 8:45 appointment for lower back/abdominal pain. Pt called at 8:30 stating her childcare fell through and she was unable to keep appointment. Pt stated she will call back to reschedule.   Pt not charged cancellation fee for this visit due to unforseen circumstance and good appointment history.  bf

## 2014-10-03 NOTE — Telephone Encounter (Signed)
Thank you for the update.  Encounter closed. 

## 2017-08-25 ENCOUNTER — Encounter: Payer: Self-pay | Admitting: Obstetrics and Gynecology

## 2017-08-25 NOTE — Progress Notes (Deleted)
36 y.o. [id number] Married Caucasian female here for annual exam.    PCP:     No LMP recorded.           Sexually active: {yes no:314532}  The current method of family planning is {contraception:315051}.    Exercising: {yes no:314532}  {types:19826} Smoker:  {YES NO:22349}  Health Maintenance: Pap:  07-13-14 Neg:Neg HR HPV History of abnormal Pap:  {YES NO:22349} MMG:  n/a Colonoscopy:  n/a BMD:   n/a  Result  n/a TDaP:  *** Gardasil:   {YES NO:22349} HIV:*** Hep C:*** Screening Labs:  Hb today: ***, Urine today: ***   reports that she has been smoking Cigarettes.  She has a 5.00 pack-year smoking history. She has never used smokeless tobacco. She reports that she drinks about 1.2 oz of alcohol per week . She reports that she does not use drugs.  Past Medical History:  Diagnosis Date  . ADD (attention deficit disorder)   . Anxiety and depression   . Hx MRSA infection   . Pulmonary nodule     Past Surgical History:  Procedure Laterality Date  . BUNIONECTOMY     2009    Current Outpatient Prescriptions  Medication Sig Dispense Refill  . acetaminophen (TYLENOL) 500 MG tablet Take 500 mg by mouth every 6 (six) hours as needed.    . ALPRAZolam (XANAX) 1 MG tablet Take 1 mg by mouth at bedtime as needed.    Marland Kitchen amphetamine-dextroamphetamine (ADDERALL) 30 MG tablet Take 30 mg by mouth daily.    Marland Kitchen ibuprofen (ADVIL,MOTRIN) 200 MG tablet Take 200 mg by mouth every 6 (six) hours as needed.    . VELIVET 0.1/0.125/0.15 -0.025 MG tablet TAKE 1 TABLET BY MOUTH EVERY DAY 28 tablet 0   No current facility-administered medications for this visit.     Family History  Problem Relation Age of Onset  . Hypertension Mother   . Hyperlipidemia Father   . Hyperlipidemia Sister   . Hypertension Sister   . Diabetes Sister   . Kidney disease Maternal Grandmother   . Stroke Maternal Grandmother   . Breast cancer Maternal Grandmother   . Heart disease Maternal Grandfather   . Hyperlipidemia  Maternal Grandfather   . Hypertension Maternal Grandfather     ROS:  Pertinent items are noted in HPI.  Otherwise, a comprehensive ROS was negative.  Exam:   There were no vitals taken for this visit.    General appearance: alert, cooperative and appears stated age Head: Normocephalic, without obvious abnormality, atraumatic Neck: no adenopathy, supple, symmetrical, trachea midline and thyroid normal to inspection and palpation Lungs: clear to auscultation bilaterally Breasts: normal appearance, no masses or tenderness, No nipple retraction or dimpling, No nipple discharge or bleeding, No axillary or supraclavicular adenopathy Heart: regular rate and rhythm Abdomen: soft, non-tender; no masses, no organomegaly Extremities: extremities normal, atraumatic, no cyanosis or edema Skin: Skin color, texture, turgor normal. No rashes or lesions Lymph nodes: Cervical, supraclavicular, and axillary nodes normal. No abnormal inguinal nodes palpated Neurologic: Grossly normal  Pelvic: External genitalia:  no lesions              Urethra:  normal appearing urethra with no masses, tenderness or lesions              Bartholins and Skenes: normal                 Vagina: normal appearing vagina with normal color and discharge, no lesions  Cervix: no lesions              Pap taken: {yes no:314532} Bimanual Exam:  Uterus:  normal size, contour, position, consistency, mobility, non-tender              Adnexa: no mass, fullness, tenderness              Rectal exam: {yes no:314532}.  Confirms.              Anus:  normal sphincter tone, no lesions  Chaperone was present for exam.  Assessment:   Well woman visit with normal exam.   Plan: Mammogram screening discussed. Recommended self breast awareness. Pap and HR HPV as above. Guidelines for Calcium, Vitamin D, regular exercise program including cardiovascular and weight bearing exercise.   Follow up annually and prn.   Additional  counseling given.  {yes [id number]. _______ minutes face to face time of which over 50% was spent in counseling.    After visit summary provided.

## 2017-10-21 ENCOUNTER — Encounter: Payer: Self-pay | Admitting: Obstetrics and Gynecology

## 2017-11-01 ENCOUNTER — Other Ambulatory Visit: Payer: Self-pay

## 2017-11-01 ENCOUNTER — Encounter (HOSPITAL_COMMUNITY): Payer: Self-pay | Admitting: Emergency Medicine

## 2017-11-01 ENCOUNTER — Emergency Department (HOSPITAL_COMMUNITY)
Admission: EM | Admit: 2017-11-01 | Discharge: 2017-11-01 | Disposition: A | Discharge status: Home / Self Care | Payer: BLUE CROSS/BLUE SHIELD | Attending: Emergency Medicine | Admitting: Emergency Medicine

## 2017-11-01 ENCOUNTER — Encounter (HOSPITAL_COMMUNITY): Payer: Self-pay

## 2017-11-01 DIAGNOSIS — Y999 Unspecified external cause status: Secondary | ICD-10-CM | POA: Insufficient documentation

## 2017-11-01 DIAGNOSIS — Z5321 Procedure and treatment not carried out due to patient leaving prior to being seen by health care provider: Secondary | ICD-10-CM | POA: Diagnosis not present

## 2017-11-01 DIAGNOSIS — Y929 Unspecified place or not applicable: Secondary | ICD-10-CM | POA: Insufficient documentation

## 2017-11-01 DIAGNOSIS — S0101XA Laceration without foreign body of scalp, initial encounter: Secondary | ICD-10-CM | POA: Insufficient documentation

## 2017-11-01 DIAGNOSIS — Y939 Activity, unspecified: Secondary | ICD-10-CM | POA: Insufficient documentation

## 2017-11-01 DIAGNOSIS — F17210 Nicotine dependence, cigarettes, uncomplicated: Secondary | ICD-10-CM | POA: Insufficient documentation

## 2017-11-01 DIAGNOSIS — R509 Fever, unspecified: Secondary | ICD-10-CM | POA: Insufficient documentation

## 2017-11-01 DIAGNOSIS — S0003XA Contusion of scalp, initial encounter: Secondary | ICD-10-CM | POA: Insufficient documentation

## 2017-11-01 DIAGNOSIS — X818XXA Intentional self-harm by jumping or lying in front of other moving object, initial encounter: Secondary | ICD-10-CM | POA: Insufficient documentation

## 2017-11-01 HISTORY — DX: Alcohol abuse, uncomplicated: F10.10

## 2017-11-01 LAB — CBC
HEMATOCRIT: 40.4 % (ref 36.0–46.0)
HEMOGLOBIN: 14.1 g/dL (ref 12.0–15.0)
MCH: 33.2 pg (ref 26.0–34.0)
MCHC: 34.9 g/dL (ref 30.0–36.0)
MCV: 95.1 fL (ref 78.0–100.0)
Platelets: 290 10*3/uL (ref 150–400)
RBC: 4.25 MIL/uL (ref 3.87–5.11)
RDW: 12.5 % (ref 11.5–15.5)
WBC: 10.2 10*3/uL (ref 4.0–10.5)

## 2017-11-01 LAB — COMPREHENSIVE METABOLIC PANEL
ALK PHOS: 86 U/L (ref 38–126)
ALT: 18 U/L (ref 14–54)
AST: 27 U/L (ref 15–41)
Albumin: 4 g/dL (ref 3.5–5.0)
Anion gap: 8 (ref 5–15)
BILIRUBIN TOTAL: 0.6 mg/dL (ref 0.3–1.2)
BUN: 8 mg/dL (ref 6–20)
CO2: 25 mmol/L (ref 22–32)
CREATININE: 0.67 mg/dL (ref 0.44–1.00)
Calcium: 9.7 mg/dL (ref 8.9–10.3)
Chloride: 105 mmol/L (ref 101–111)
GFR calc Af Amer: >60 mL/min (ref >60)
GFR calc non Af Amer: >60 mL/min (ref >60)
Glucose, Bld: 103 mg/dL — ABNORMAL HIGH (ref 65–99)
Potassium: 4.2 mmol/L (ref 3.5–5.1)
Sodium: 138 mmol/L (ref 135–145)
TOTAL PROTEIN: 7.3 g/dL (ref 6.5–8.1)

## 2017-11-01 LAB — I-STAT BETA HCG BLOOD, ED (MC, WL, AP ONLY)

## 2017-11-01 LAB — ETHANOL: Alcohol, Ethyl (B): 24 mg/dL — ABNORMAL HIGH (ref <10)

## 2017-11-01 LAB — RAPID URINE DRUG SCREEN, HOSP PERFORMED
AMPHETAMINES: POSITIVE — AB
BARBITURATES: NOT DETECTED
BENZODIAZEPINES: POSITIVE — AB
COCAINE: POSITIVE — AB
Opiates: NOT DETECTED
TETRAHYDROCANNABINOL: POSITIVE — AB

## 2017-11-01 LAB — SALICYLATE LEVEL: Salicylate Lvl: <7 mg/dL (ref 2.8–30.0)

## 2017-11-01 LAB — ACETAMINOPHEN LEVEL: Acetaminophen (Tylenol), Serum: 31 ug/mL — ABNORMAL HIGH (ref 10–30)

## 2017-11-01 NOTE — ED Notes (Signed)
Pt states she took an old antibiotic.

## 2017-11-01 NOTE — ED Triage Notes (Addendum)
Per EMS- patient was scheduled for [facility] admission today, but reported a fall and had a temperature of 102.1 tympanic. Patient needs medical clearance. Patient was given Tylenol 1000 mg prior to arrival to the ED.

## 2017-11-01 NOTE — ED Notes (Signed)
Called Pt in lobby for vital recheck, no response.
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
## 2017-11-01 NOTE — ED Notes (Signed)
Dylan, NT called patient to update vital signs with no answer.  

## 2017-11-01 NOTE — ED Notes (Signed)
Pt called from the lobby with no response x3 

## 2017-11-02 ENCOUNTER — Emergency Department (HOSPITAL_COMMUNITY): Payer: Self-pay

## 2017-11-02 ENCOUNTER — Emergency Department (HOSPITAL_COMMUNITY)
Admission: EM | Admit: 2017-11-02 | Discharge: 2017-11-02 | Disposition: A | Discharge status: Home / Self Care | Payer: Self-pay | Attending: Emergency Medicine | Admitting: Emergency Medicine

## 2017-11-02 DIAGNOSIS — S0003XA Contusion of scalp, initial encounter: Secondary | ICD-10-CM

## 2017-11-02 NOTE — ED Notes (Signed)
Patient transported to CT 
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
## 2017-11-02 NOTE — Discharge Instructions (Signed)
Your head CT did not show any concerning skull fracture or bleeding in your brain. It is still possible that you may be recovering from a mild concussion. Avoid any strenuous activity or heavy lifting for 1 week. Continue to alternate Tylenol and Motrin for persistent headache. You may follow with your primary care doctor as needed if headache persists. Keep your scalp wound clean and dry with mild soap and warm water. Do not scrub the area vigorously as this may cause the wound to bleed. You may return to the emergency department, as needed, for new or concerning symptoms.

## 2017-11-18 ENCOUNTER — Encounter: Payer: Self-pay | Admitting: Obstetrics and Gynecology

## 2017-12-16 ENCOUNTER — Ambulatory Visit (INDEPENDENT_AMBULATORY_CARE_PROVIDER_SITE_OTHER): Payer: Self-pay | Admitting: Obstetrics and Gynecology

## 2017-12-16 ENCOUNTER — Other Ambulatory Visit (HOSPITAL_COMMUNITY)
Admission: RE | Admit: 2017-12-16 | Discharge: 2017-12-16 | Disposition: A | Discharge status: Home / Self Care | Payer: Self-pay | Source: Ambulatory Visit | Attending: Obstetrics and Gynecology | Admitting: Obstetrics and Gynecology

## 2017-12-16 ENCOUNTER — Encounter: Payer: Self-pay | Admitting: Obstetrics and Gynecology

## 2017-12-16 ENCOUNTER — Other Ambulatory Visit: Payer: Self-pay

## 2017-12-16 VITALS — BP 112/68 | HR 90 | Resp 16 | Ht 64.5 in | Wt 143.0 lb

## 2017-12-16 DIAGNOSIS — Z1151 Encounter for screening for human papillomavirus (HPV): Secondary | ICD-10-CM | POA: Insufficient documentation

## 2017-12-16 DIAGNOSIS — Z01419 Encounter for gynecological examination (general) (routine) without abnormal findings: Secondary | ICD-10-CM

## 2017-12-16 MED ORDER — NORETHINDRONE 0.35 MG PO TABS: 1.0000 | ORAL_TABLET | Freq: Every day | ORAL | 3 refills | Status: DC

## 2017-12-16 NOTE — Patient Instructions (Signed)

## 2017-12-16 NOTE — Progress Notes (Signed)
37 y.o. [id number] Married Caucasian female here as an old/new patient for an annual exam.  Patient states that she would like to discuss contraception.  Smoker.  Had a Mirena IUD in the past and it expelled.   States she had STD testing at [facility].  Being treated for alcohol addiction.  UPT - negative.   PCP:  Bradly Bienenstock, MD   Patient's last menstrual period was 12/09/2017.     Period Cycle (Days): 28 Period Duration (Days): 5 Period Pattern: Regular Menstrual Flow: Moderate Menstrual Control: Tampon Menstrual Control Change Freq (Hours): 4-5 Dysmenorrhea: (!) Mild Dysmenorrhea Symptoms: Other (Comment)(back pain)     Sexually active: Yes.    The current method of family planning is none.    Exercising: No.  The patient does not participate in regular exercise at present. Smoker:  Yes, half pack per day  Health Maintenance: Pap:  07/13/14 Neg. HR HPV:neg  History of abnormal Pap:  no TDaP:  Up to date per patient  Gardasil:   n/a HIV and Hep C: January 2019 per patient negative -- done at [facility]:  Per patient labs done at [facility]   reports that she has been smoking cigarettes.  She has a 5.00 pack-year smoking history. she has never used smokeless tobacco. She reports that she does not drink alcohol or use drugs.  Past Medical History:  Diagnosis Date  . ADD (attention deficit disorder)   . Anxiety and depression   . ETOH abuse   . Hx MRSA infection   . Pulmonary nodule     Past Surgical History:  Procedure Laterality Date  . BUNIONECTOMY     2009    Current Outpatient Medications  Medication Sig Dispense Refill  . acetaminophen (TYLENOL) 500 MG tablet Take 500 mg by mouth every 6 (six) hours as needed.    Marland Kitchen albuterol (PROVENTIL HFA;VENTOLIN HFA) 108 (90 Base) MCG/ACT inhaler Inhale into the lungs as needed.    Marland Kitchen atomoxetine (STRATTERA) 40 MG capsule Take 1 capsule by mouth daily.    Marland Kitchen ibuprofen  (ADVIL,MOTRIN) 200 MG tablet Take 200 mg by mouth every 6 (six) hours as needed.    Marland Kitchen NALTREXONE IM Inject into the muscle.    . sertraline (ZOLOFT) 50 MG tablet Take by mouth daily.    . traZODone (DESYREL) 100 MG tablet Take by mouth as needed.    . ALPRAZolam (XANAX) 1 MG tablet Take 1 mg by mouth at bedtime as needed.     No current facility-administered medications for this visit.     Family History  Problem Relation Age of Onset  . Hypertension Mother   . Hyperlipidemia Father   . Hyperlipidemia Sister   . Hypertension Sister   . Diabetes Sister   . Breast cancer Maternal Grandmother   . Heart disease Maternal Grandfather   . Hyperlipidemia Maternal Grandfather   . Hypertension Maternal Grandfather   . Stroke Paternal Grandmother   . Kidney disease Paternal Grandmother     ROS:  Pertinent items are noted in HPI.  Otherwise, a comprehensive ROS was negative.  Exam:   BP 112/68 (BP Location: Right Arm, Patient Position: Sitting, Cuff Size: Normal)   Pulse 90   Resp 16   Ht 5' 4.5" (1.638 m)   Wt 143 lb (64.9 kg)   LMP 12/09/2017   BMI 24.17 kg/m     General appearance: alert, cooperative and appears stated age Head: Normocephalic, without obvious abnormality, atraumatic  Neck: no adenopathy, supple, symmetrical, trachea midline and thyroid normal to inspection and palpation Lungs: clear to auscultation bilaterally Breasts: normal appearance, no masses or tenderness, No nipple retraction or dimpling, No nipple discharge or bleeding, No axillary or supraclavicular adenopathy Heart: regular rate and rhythm Abdomen: soft, non-tender; no masses, no organomegaly Extremities: extremities normal, atraumatic, no cyanosis or edema Skin: Skin color, texture, turgor normal. No rashes or lesions Lymph nodes: Cervical, supraclavicular, and axillary nodes normal. No abnormal inguinal nodes palpated Neurologic: Grossly normal  Pelvic: External genitalia:  no lesions               Urethra:  normal appearing urethra with no masses, tenderness or lesions              Bartholins and Skenes: normal                 Vagina: normal appearing vagina with normal color and discharge, no lesions              Cervix: no lesions              Pap taken: Yes.   Bimanual Exam:  Uterus:  normal size, contour, position, consistency, mobility, non-tender              Adnexa: no mass, fullness, tenderness               Chaperone was present for exam.  Assessment:   Well woman visit with normal exam. Need for contraception.  Smoker over age 2.   Plan: Mammogram screening discussed.  Will do at age 36.  Recommended self breast awareness. Pap and HR HPV as above. Guidelines for Calcium, Vitamin D, regular exercise program including cardiovascular and weight bearing exercise. Discussed Gardasil vaccine.  Discussed contraception options.  Will start Micronor with next menses.  Instructed in use. Rx for one year.    Follow up annually and prn.   After visit summary provided.

## 2017-12-22 LAB — CYTOLOGY - PAP
Diagnosis: NEGATIVE
HPV (WINDOPATH): NOT DETECTED

## 2018-02-01 ENCOUNTER — Telehealth: Payer: Self-pay | Admitting: Obstetrics and Gynecology

## 2018-02-01 NOTE — Telephone Encounter (Signed)
Spoke with patient. Patient requesting Rx for Xanax for "panic attacks". States she is going through separation with spouse and things have been difficult, "needs something temporary for anxiety".   Zoloft, trazodone and Strattera managed by PCP, has seen psychiatrist recently at [facility]. Patient states she is not currently taking strattera for ADHD d/t cost, "has not seen PCP recently". Per review of Epic, seen by PCP on 12/02/17, patient states she was not seen by PCP on 12/02/17 date due no insurance coverage, was a later date.  Instructed patient to f/u with PCP or psychiatrist for further evaluation of "panic attacks". If symptoms worsen or new symptoms develop, such as difficulty breathing, SI/HI, seek immediate evaluation at ER. Dr. Quincy Simmonds will review, I will return call with any additional recommendations.   Routing to provider for final review. Patient is agreeable to disposition. Will close encounter.

## 2018-02-01 NOTE — Telephone Encounter (Signed)
Patient is asking for a new prescription for her "panic attacks". Patient is going through a divorce and no longer has insurance coverage with her spouse.  Confirmed pharmacy on file.

## 2018-04-15 ENCOUNTER — Ambulatory Visit (HOSPITAL_COMMUNITY): Payer: Self-pay | Admitting: Psychiatry

## 2018-07-26 NOTE — Progress Notes (Deleted)
GYNECOLOGY  VISIT   HPI: 37 y.o.   Legally Separated  Caucasian  female   [id number] with No LMP recorded.   here for std testing.     GYNECOLOGIC HISTORY: No LMP recorded. Contraception:  OCP Menopausal hormone therapy:  none Last mammogram:  n/a Last pap smear:   12/16/2017 normal        OB History    Gravida  6   Para  3   Term  3   Preterm      AB  3   Living  3     SAB  1   TAB      Ectopic  1   Multiple      Live Births  3              Patient Active Problem List   Diagnosis Date Noted  . Pulmonary nodule   . ADD (attention deficit disorder)   . Anxiety and depression     Past Medical History:  Diagnosis Date  . ADD (attention deficit disorder)   . Anxiety and depression   . ETOH abuse   . Hx MRSA infection   . Pulmonary nodule     Past Surgical History:  Procedure Laterality Date  . BUNIONECTOMY     2009    Current Outpatient Medications  Medication Sig Dispense Refill  . acetaminophen (TYLENOL) 500 MG tablet Take 500 mg by mouth every 6 (six) hours as needed.    Marland Kitchen albuterol (PROVENTIL HFA;VENTOLIN HFA) 108 (90 Base) MCG/ACT inhaler Inhale into the lungs as needed.    . ALPRAZolam (XANAX) 1 MG tablet Take 1 mg by mouth at bedtime as needed.    Marland Kitchen atomoxetine (STRATTERA) 40 MG capsule Take 1 capsule by mouth daily.    Marland Kitchen ibuprofen (ADVIL,MOTRIN) 200 MG tablet Take 200 mg by mouth every 6 (six) hours as needed.    Marland Kitchen NALTREXONE IM Inject into the muscle.    . norethindrone (MICRONOR,CAMILA,ERRIN) 0.35 MG tablet Take 1 tablet (0.35 mg total) by mouth daily. 3 Package 3  . sertraline (ZOLOFT) 50 MG tablet Take by mouth daily.    . traZODone (DESYREL) 100 MG tablet Take by mouth as needed.     No current facility-administered medications for this visit.      ALLERGIES: Doxycycline hyclate and Phenergan [promethazine hcl]  Family History  Problem Relation Age of Onset  . Hypertension Mother   . Hyperlipidemia Father   . Hyperlipidemia  Sister   . Hypertension Sister   . Diabetes Sister   . Breast cancer Maternal Grandmother   . Heart disease Maternal Grandfather   . Hyperlipidemia Maternal Grandfather   . Hypertension Maternal Grandfather   . Stroke Paternal Grandmother   . Kidney disease Paternal Grandmother     Social History   Socioeconomic History  . Marital status: Legally Separated    Spouse name: Not on file  . Number of children: 3  . Years of education: Not on file  . Highest education level: Not on file  Occupational History  . Occupation: homemaker  Social Needs  . Financial resource strain: Not on file  . Food insecurity:    Worry: Not on file    Inability: Not on file  . Transportation needs:    Medical: Not on file    Non-medical: Not on file  Tobacco Use  . Smoking status: Current Every Day Smoker    Packs/day: 0.50    Years: 10.00  Pack years: 5.00    Types: Cigarettes  . Smokeless tobacco: Never Used  Substance and Sexual Activity  . Alcohol use: No    Frequency: Never  . Drug use: No  . Sexual activity: Yes    Partners: Male    Birth control/protection: None  Lifestyle  . Physical activity:    Days per week: Not on file    Minutes per session: Not on file  . Stress: Not on file  Relationships  . Social connections:    Talks on phone: Not on file    Gets together: Not on file    Attends religious service: Not on file    Active member of club or organization: Not on file    Attends meetings of clubs or organizations: Not on file    Relationship status: Not on file  . Intimate partner violence:    Fear of current or ex partner: Not on file    Emotionally abused: Not on file    Physically abused: Not on file    Forced sexual activity: Not on file  Other Topics Concern  . Not on file  Social History Narrative  . Not on file    Review of Systems  Constitutional: Negative.   HENT: Negative.   Eyes: Negative.   Respiratory: Negative.   Cardiovascular: Negative.    Gastrointestinal: Negative.   Endocrine: Negative.   Genitourinary: Negative.   Musculoskeletal: Negative.   Skin: Negative.   Allergic/Immunologic: Negative.   Neurological: Negative.   Hematological: Negative.   Psychiatric/Behavioral: Negative.   All other systems reviewed and are negative.   PHYSICAL EXAMINATION:    There were no vitals taken for this visit.    General appearance: alert, cooperative and appears stated age Head: Normocephalic, without obvious abnormality, atraumatic Neck: no adenopathy, supple, symmetrical, trachea midline and thyroid normal to inspection and palpation Lungs: clear to auscultation bilaterally Breasts: normal appearance, no masses or tenderness, No nipple retraction or dimpling, No nipple discharge or bleeding, No axillary or supraclavicular adenopathy Heart: regular rate and rhythm Abdomen: soft, non-tender, no masses,  no organomegaly Extremities: extremities normal, atraumatic, no cyanosis or edema Skin: Skin color, texture, turgor normal. No rashes or lesions Lymph nodes: Cervical, supraclavicular, and axillary nodes normal. No abnormal inguinal nodes palpated Neurologic: Grossly normal  Pelvic: External genitalia:  no lesions              Urethra:  normal appearing urethra with no masses, tenderness or lesions              Bartholins and Skenes: normal                 Vagina: normal appearing vagina with normal color and discharge, no lesions              Cervix: no lesions                Bimanual Exam:  Uterus:  normal size, contour, position, consistency, mobility, non-tender              Adnexa: no mass, fullness, tenderness              Rectal exam: {yes no:314532}.  Confirms.              Anus:  normal sphincter tone, no lesions  Chaperone was present for exam.  ASSESSMENT     PLAN     An After Visit Summary was printed and given to the patient.  ______  minutes face to face time of which over 50% was spent in  counseling.

## 2018-07-28 ENCOUNTER — Ambulatory Visit: Payer: Self-pay | Admitting: Obstetrics and Gynecology

## 2018-07-28 ENCOUNTER — Encounter: Payer: Self-pay | Admitting: Obstetrics and Gynecology

## 2018-07-28 ENCOUNTER — Telehealth: Payer: Self-pay | Admitting: Obstetrics and Gynecology

## 2018-07-28 NOTE — Telephone Encounter (Signed)
[place] for letter to be sent to patient.  Encounter closed.

## 2018-07-28 NOTE — Telephone Encounter (Signed)
Called number listed regarding missed appointment. No longer patients' number.

## 2018-08-04 ENCOUNTER — Telehealth (HOSPITAL_COMMUNITY): Payer: Self-pay | Admitting: Psychology

## 2018-10-06 ENCOUNTER — Encounter

## 2019-07-31 ENCOUNTER — Emergency Department (HOSPITAL_BASED_OUTPATIENT_CLINIC_OR_DEPARTMENT_OTHER)
Admission: EM | Admit: 2019-07-31 | Discharge: 2019-07-31 | Disposition: A | Discharge status: Home / Self Care | Payer: Medicaid Other | Attending: Emergency Medicine | Admitting: Emergency Medicine

## 2019-07-31 ENCOUNTER — Emergency Department (HOSPITAL_BASED_OUTPATIENT_CLINIC_OR_DEPARTMENT_OTHER): Payer: Medicaid Other

## 2019-07-31 ENCOUNTER — Other Ambulatory Visit: Payer: Self-pay

## 2019-07-31 ENCOUNTER — Encounter (HOSPITAL_BASED_OUTPATIENT_CLINIC_OR_DEPARTMENT_OTHER): Payer: Self-pay

## 2019-07-31 DIAGNOSIS — F17210 Nicotine dependence, cigarettes, uncomplicated: Secondary | ICD-10-CM | POA: Diagnosis not present

## 2019-07-31 DIAGNOSIS — Z79899 Other long term (current) drug therapy: Secondary | ICD-10-CM | POA: Insufficient documentation

## 2019-07-31 DIAGNOSIS — Z888 Allergy status to other drugs, medicaments and biological substances status: Secondary | ICD-10-CM | POA: Diagnosis not present

## 2019-07-31 DIAGNOSIS — Z881 Allergy status to other antibiotic agents status: Secondary | ICD-10-CM | POA: Diagnosis not present

## 2019-07-31 DIAGNOSIS — R0789 Other chest pain: Secondary | ICD-10-CM | POA: Diagnosis not present

## 2019-07-31 LAB — BASIC METABOLIC PANEL
Anion gap: 10 (ref 5–15)
BUN: 15 mg/dL (ref 6–20)
CO2: 22 mmol/L (ref 22–32)
Calcium: 8.8 mg/dL — ABNORMAL LOW (ref 8.9–10.3)
Chloride: 106 mmol/L (ref 98–111)
Creatinine, Ser: 0.73 mg/dL (ref 0.44–1.00)
GFR calc Af Amer: >60 mL/min (ref >60)
GFR calc non Af Amer: >60 mL/min (ref >60)
Glucose, Bld: 87 mg/dL (ref 70–99)
Potassium: 3.7 mmol/L (ref 3.5–5.1)
Sodium: 138 mmol/L (ref 135–145)

## 2019-07-31 LAB — CBC WITH DIFFERENTIAL/PLATELET
Abs Immature Granulocytes: 0.02 10*3/uL (ref 0.00–0.07)
Basophils Absolute: 0 10*3/uL (ref 0.0–0.1)
Basophils Relative: 1 %
Eosinophils Absolute: 0.1 10*3/uL (ref 0.0–0.5)
Eosinophils Relative: 1 %
HCT: 41 % (ref 36.0–46.0)
Hemoglobin: 13.6 g/dL (ref 12.0–15.0)
Immature Granulocytes: 0 %
Lymphocytes Relative: 32 %
Lymphs Abs: 2.3 10*3/uL (ref 0.7–4.0)
MCH: 33.1 pg (ref 26.0–34.0)
MCHC: 33.2 g/dL (ref 30.0–36.0)
MCV: 99.8 fL (ref 80.0–100.0)
Monocytes Absolute: 0.5 10*3/uL (ref 0.1–1.0)
Monocytes Relative: 6 %
Neutro Abs: 4.3 10*3/uL (ref 1.7–7.7)
Neutrophils Relative %: 60 %
Platelets: 287 10*3/uL (ref 150–400)
RBC: 4.11 MIL/uL (ref 3.87–5.11)
RDW: 14 % (ref 11.5–15.5)
WBC: 7.1 10*3/uL (ref 4.0–10.5)
nRBC: 0 % (ref 0.0–0.2)

## 2019-07-31 LAB — TROPONIN I (HIGH SENSITIVITY): Troponin I (High Sensitivity): <2 ng/L (ref <18)

## 2019-07-31 LAB — PREGNANCY, URINE: Preg Test, Ur: NEGATIVE

## 2019-07-31 LAB — D-DIMER, QUANTITATIVE: D-Dimer, Quant: <0.27 ug/mL-FEU (ref 0.00–0.50)

## 2019-07-31 NOTE — ED Triage Notes (Signed)
Called pt and pt came running out of the bathroom to the triage room with no difficulty. Pt c/o sharp R sided chest pain and is non-radiating. Denies ShOB, or diaphoresis. Pt does have some nausea.

## 2019-07-31 NOTE — ED Provider Notes (Signed)
[facility] EMERGENCY DEPARTMENT Provider Note   CSN: [id number] Arrival date & time: 07/31/19  1617     History   Chief Complaint Chief Complaint  Patient presents with  . Chest Pain    HPI Connie Reed is a 38 y.o. female.     The history is provided by the patient.  Chest Pain Pain location:  Substernal area Pain quality: aching   Pain radiates to:  Does not radiate Pain severity:  Mild Onset quality:  Gradual Timing:  Constant Progression:  Unchanged Chronicity:  New Context: at rest   Relieved by:  Nothing Associated symptoms: cough   Associated symptoms: no abdominal pain, no altered mental status, no anorexia, no anxiety, no back pain, no claudication, no fever, no nausea, no palpitations, no shortness of breath and no vomiting   Risk factors: no birth control, no coronary artery disease, no high cholesterol, no hypertension, not pregnant and no prior DVT/PE     Past Medical History:  Diagnosis Date  . ADD (attention deficit disorder)   . Anxiety and depression   . ETOH abuse   . Hx MRSA infection   . Pulmonary nodule     Patient Active Problem List   Diagnosis Date Noted  . Pulmonary nodule   . ADD (attention deficit disorder)   . Anxiety and depression     Past Surgical History:  Procedure Laterality Date  . BUNIONECTOMY     2009     OB History    Gravida  6   Para  3   Term  3   Preterm      AB  3   Living  3     SAB  1   TAB      Ectopic  1   Multiple      Live Births  3            Home Medications    Prior to Admission medications   Medication Sig Start Date End Date Taking? Authorizing Provider  acetaminophen (TYLENOL) 500 MG tablet Take 500 mg by mouth every 6 (six) hours as needed.    [provider]  albuterol (PROVENTIL HFA;VENTOLIN HFA) 108 (90 Base) MCG/ACT inhaler Inhale into the lungs as needed. 12/02/17 12/02/18  [provider]  ALPRAZolam Duanne Moron) 1 MG tablet Take 1 mg by  mouth at bedtime as needed.    [provider]  atomoxetine (STRATTERA) 40 MG capsule Take 1 capsule by mouth daily. 12/02/17   [provider]  ibuprofen (ADVIL,MOTRIN) 200 MG tablet Take 200 mg by mouth every 6 (six) hours as needed.    [provider]  NALTREXONE IM Inject into the muscle.    [provider]  norethindrone (MICRONOR,CAMILA,ERRIN) 0.35 MG tablet Take 1 tablet (0.35 mg total) by mouth daily. 12/16/17   Nunzio Cobbs, MD  sertraline (ZOLOFT) 50 MG tablet Take by mouth daily. 12/02/17   [provider]  traZODone (DESYREL) 100 MG tablet Take by mouth as needed. 12/02/17   [provider]    Family History Family History  Problem Relation Age of Onset  . Hypertension Mother   . Hyperlipidemia Father   . Hyperlipidemia Sister   . Hypertension Sister   . Diabetes Sister   . Breast cancer Maternal Grandmother   . Heart disease Maternal Grandfather   . Hyperlipidemia Maternal Grandfather   . Hypertension Maternal Grandfather   . Stroke Paternal Grandmother   .  Kidney disease Paternal Grandmother     Social History Social History   Tobacco Use  . Smoking status: Current Every Day Smoker    Packs/day: 0.50    Years: 10.00    Pack years: 5.00    Types: Cigarettes  . Smokeless tobacco: Never Used  Substance Use Topics  . Alcohol use: Yes    Frequency: Never  . Drug use: Yes    Types: Marijuana     Allergies   Doxycycline hyclate and Phenergan [promethazine hcl]   Review of Systems Review of Systems  Constitutional: Negative for chills and fever.  HENT: Negative for ear pain and sore throat.   Eyes: Negative for pain and visual disturbance.  Respiratory: Positive for cough. Negative for shortness of breath.   Cardiovascular: Positive for chest pain. Negative for palpitations and claudication.  Gastrointestinal: Negative for abdominal pain, anorexia, nausea and vomiting.  Genitourinary: Negative for  dysuria and hematuria.  Musculoskeletal: Negative for arthralgias and back pain.  Skin: Negative for color change and rash.  Neurological: Negative for seizures and syncope.  All other systems reviewed and are negative.    Physical Exam Updated Vital Signs  ED Triage Vitals  Enc Vitals Group     BP 07/31/19 1625 139/72     Pulse Rate 07/31/19 1625 (!) 106     Resp 07/31/19 1625 20     Temp 07/31/19 1625 98.4 F (36.9 C)     Temp Source 07/31/19 1625 Oral     SpO2 07/31/19 1625 100 %     Weight 07/31/19 1626 155 lb (70.3 kg)     Height 07/31/19 1626 5' 3.5" (1.613 m)     Head Circumference --      Peak Flow --      Pain Score 07/31/19 1625 6     Pain Loc --      Pain Edu? --      Excl. in [place]? --     Physical Exam Vitals signs and nursing note reviewed.  Constitutional:      General: Connie Reed is not in acute distress.    Appearance: Connie Reed is well-developed.  HENT:     Head: Normocephalic and atraumatic.  Eyes:     Extraocular Movements: Extraocular movements intact.     Conjunctiva/sclera: Conjunctivae normal.     Pupils: Pupils are equal, round, and reactive to light.  Neck:     Musculoskeletal: Normal range of motion and neck supple.  Cardiovascular:     Rate and Rhythm: Normal rate and regular rhythm.     Pulses:          Radial pulses are 2+ on the right side and 2+ on the left side.     Heart sounds: Normal heart sounds. No murmur.  Pulmonary:     Effort: Pulmonary effort is normal. No respiratory distress.     Breath sounds: Normal breath sounds. No decreased breath sounds, wheezing, rhonchi or rales.  Abdominal:     Palpations: Abdomen is soft.     Tenderness: There is no abdominal tenderness.  Musculoskeletal: Normal range of motion.     Right lower leg: No edema.     Left lower leg: No edema.  Skin:    General: Skin is warm and dry.     Capillary Refill: Capillary refill takes less than 2 seconds.  Neurological:     General: No focal deficit present.      Mental Status: Connie Reed is alert.  Psychiatric:  Mood and Affect: Mood normal.      ED Treatments / Results  Labs (all labs ordered are listed, but only abnormal results are displayed) Labs Reviewed  BASIC METABOLIC PANEL - Abnormal; Notable for the following components:      Result Value   Calcium 8.8 (*)    All other components within normal limits  CBC WITH DIFFERENTIAL/PLATELET  D-DIMER, QUANTITATIVE (NOT AT [facility])  PREGNANCY, URINE  TROPONIN I (HIGH SENSITIVITY)    EKG EKG Interpretation  Date/Time:  Monday July 31 2019 16:28:51 EDT Ventricular Rate:  110 PR Interval:  134 QRS Duration: 92 QT Interval:  316 QTC Calculation: 427 R Axis:   86 Text Interpretation:  Sinus tachycardia Otherwise normal ECG Confirmed by Lennice Sites [phone]) on 07/31/2019 4:34:48 PM   Radiology Dg Chest Portable 1 View  Result Date: 07/31/2019 CLINICAL DATA:  Acute chest pain today.  Initial encounter. EXAM: PORTABLE CHEST 1 VIEW COMPARISON:  None. FINDINGS: The cardiomediastinal silhouette is unremarkable. There is no evidence of focal airspace disease, pulmonary edema, suspicious pulmonary nodule/mass, pleural effusion, or pneumothorax. No acute bony abnormalities are identified. IMPRESSION: No active disease. Electronically Signed   By: Margarette Canada M.D.   On: 07/31/2019 18:27    Procedures Procedures (including critical care time)  Medications Ordered in ED Medications - No data to display   Initial Impression / Assessment and Plan / ED Course  I have reviewed the triage vital signs and the nursing notes.  Pertinent labs & imaging results that were available during my care of the patient were reviewed by me and considered in my medical decision making (see chart for details).     Connie Reed is a 38 year old female with history of anxiety and depression who presents to the ED with chest pain.  Patient with normal vitals.  No fever.  Pain for the last 2 days.  EKG shows sinus  rhythm.  No ischemic changes.  D-dimer negative.  Doubt PE.  Has had some infectious type symptoms.  However chest x-ray showed no signs of pneumonia, no pneumothorax, no pleural effusion.  Was tested for coronavirus today and awaiting results.  Otherwise no significant leukocytosis, anemia, electrolyte abnormality, kidney injury.  Troponin normal.  Chest pain does not of sound cardiac in nature.  Likely musculoskeletal versus reflux versus viral related.  Recommend Tylenol, Motrin.  Recommend follow-up primary care doctor if pain is persistent and told to return to the ED if symptoms worsen.  Discharged from ED in good condition.  Given return precautions.  This chart was dictated using voice recognition software.  Despite best efforts to proofread,  errors can occur which can change the documentation meaning.      Final Clinical Impressions(s) / ED Diagnoses   Final diagnoses:  Atypical chest pain    ED Discharge Orders    None       Lennice Sites, DO 07/31/19 2028

## 2019-08-03 ENCOUNTER — Other Ambulatory Visit: Payer: Self-pay

## 2019-08-03 DIAGNOSIS — Z20822 Contact with and (suspected) exposure to covid-19: Secondary | ICD-10-CM

## 2019-08-05 LAB — NOVEL CORONAVIRUS, NAA: SARS-CoV-2, NAA: NOT DETECTED

## 2019-08-07 ENCOUNTER — Telehealth: Payer: Self-pay

## 2019-08-07 NOTE — Telephone Encounter (Signed)
Negative COVID results given. Patient results "NOT Detected." Caller expressed understanding. ° °

## 2019-10-27 DIAGNOSIS — U071 COVID-19: Secondary | ICD-10-CM

## 2019-10-27 HISTORY — DX: COVID-19: U07.1

## 2021-03-11 NOTE — Progress Notes (Signed)
40 y.o. [id number] Legally Separated Caucasian female here for annual exam.     Took morning after pill recently.  Wants birth control and STD screening.   Had a Mirena IUD which expelled.  Thinks she wants to try again but would like a pill in the meantime.   Unprotected intercourse on 03/02/21.  Took Plan B within 24 hours post intercourse.   Divorced.  Living with her sister. Children are with her ex-husband: 10 and 14 you girls.   Had Covid in Jan. 2021.  PCP:  None   Patient's last menstrual period was 03/01/2021 (exact date).     Period Cycle (Days): 30 Period Duration (Days): 4 Period Pattern: Regular Menstrual Flow: Moderate Menstrual Control: Tampon Menstrual Control Change Freq (Hours): changes tampon every 4-6 hours on heaviest day Dysmenorrhea: None     Sexually active: Yes.    The current method of family planning is none.    Exercising: Yes.    works out at gym Smoker:  Yes,  [place] 1/2 ppd  Health Maintenance: Pap: 12-16-17 Neg:Neg HR HPV, 07-13-14 Neg:Neg HR HPV History of abnormal Pap:  no MMG:  none Colonoscopy:  none BMD:   n/a  Result  n/a TDaP:  10 years--declines Gardasil:   no HIV:2019 Neg per patient Hep C:2019 Neg per patient Screening Labs:  Today.    reports that she has been smoking cigarettes. She has a 5.00 pack-year smoking history. She has never used smokeless tobacco. She reports current alcohol use of about 2.0 standard drinks of alcohol per week. She reports current drug use. Drug: Marijuana.  Past Medical History:  Diagnosis Date  . ADD (attention deficit disorder)   . Anxiety and depression   . COVID 10/2019  . Ectopic pregnancy 2006   treated with MTX.  Marland Kitchen ETOH abuse   . Hx MRSA infection   . Pulmonary nodule     Past Surgical History:  Procedure Laterality Date  . BUNIONECTOMY     2009    Current Outpatient Medications  Medication Sig Dispense Refill  . acetaminophen (TYLENOL) 500 MG tablet Take 500 mg by mouth every 6  (six) hours as needed.    . ALPRAZolam (XANAX) 1 MG tablet Take 1 mg by mouth at bedtime as needed.    Marland Kitchen amphetamine-dextroamphetamine (ADDERALL) 30 MG tablet Take 30 mg by mouth daily.    Marland Kitchen FLUoxetine (PROZAC) 40 MG capsule Take by mouth.    Marland Kitchen ibuprofen (ADVIL,MOTRIN) 200 MG tablet Take 200 mg by mouth every 6 (six) hours as needed.    . norethindrone (MICRONOR) 0.35 MG tablet Take 1 tablet (0.35 mg total) by mouth daily. 28 tablet 0  . albuterol (PROVENTIL HFA;VENTOLIN HFA) 108 (90 Base) MCG/ACT inhaler Inhale into the lungs as needed.     No current facility-administered medications for this visit.    Family History  Problem Relation Age of Onset  . Hypertension Mother   . Hyperlipidemia Father   . Hyperlipidemia Sister   . Hypertension Sister   . Diabetes Sister   . Breast cancer Maternal Grandmother   . Heart disease Maternal Grandfather   . Hyperlipidemia Maternal Grandfather   . Hypertension Maternal Grandfather   . Stroke Paternal Grandmother   . Kidney disease Paternal Grandmother     Review of Systems  All other systems reviewed and are negative.   Exam:   BP 112/78 (Cuff Size: Large)   Pulse (!) 110   Ht 5' 4.5" (1.638 m)   Wt  165 lb (74.8 kg)   LMP 03/01/2021 (Exact Date)   SpO2 98%   BMI 27.88 kg/m     General appearance: alert, cooperative and appears stated age Head: normocephalic, without obvious abnormality, atraumatic Neck: no adenopathy, supple, symmetrical, trachea midline and thyroid normal to inspection and palpation Lungs: clear to auscultation bilaterally Breasts: normal appearance, no masses or tenderness, No nipple retraction or dimpling, No nipple discharge or bleeding, No axillary adenopathy Heart: regular rate and rhythm Abdomen: soft, non-tender; no masses, no organomegaly Extremities: extremities normal, atraumatic, no cyanosis or edema Skin: skin color, texture, turgor normal. No rashes or lesions Lymph nodes: cervical, supraclavicular,  and axillary nodes normal. Neurologic: grossly normal  Pelvic: External genitalia:  no lesions              No abnormal inguinal nodes palpated.              Urethra:  normal appearing urethra with no masses, tenderness or lesions              Bartholins and Skenes: normal                 Vagina: normal appearing vagina with normal color and discharge, no lesions              Cervix: no lesions              Pap taken: No. Bimanual Exam:  Uterus:  normal size, contour, position, consistency, mobility, non-tender              Adnexa: no mass, fullness, tenderness              Rectal exam: Yes.  .  Confirms.              Anus:  normal sphincter tone, no lesions  Chaperone was present for exam.  Assessment:   Well woman visit with normal exam. Smoker.   Plan: Mammogram screening discussed.  She will schedule.  Self breast awareness reviewed. Pap and HR HPV as above. Guidelines for Calcium, Vitamin D, regular exercise program including cardiovascular and weight bearing exercise. Start Micronor.  Return for Mirena IUD.  STD screening and routine labs.  We discussed Covid vaccination.  Follow up annually and prn.

## 2021-03-13 ENCOUNTER — Ambulatory Visit (INDEPENDENT_AMBULATORY_CARE_PROVIDER_SITE_OTHER): Payer: Medicaid Other | Admitting: Obstetrics and Gynecology

## 2021-03-13 ENCOUNTER — Other Ambulatory Visit (HOSPITAL_COMMUNITY)
Admission: RE | Admit: 2021-03-13 | Discharge: 2021-03-13 | Disposition: A | Discharge status: Home / Self Care | Payer: Medicaid Other | Source: Ambulatory Visit | Attending: Obstetrics and Gynecology | Admitting: Obstetrics and Gynecology

## 2021-03-13 ENCOUNTER — Other Ambulatory Visit: Payer: Self-pay

## 2021-03-13 ENCOUNTER — Encounter: Payer: Self-pay | Admitting: Obstetrics and Gynecology

## 2021-03-13 VITALS — BP 112/78 | HR 110 | Ht 64.5 in | Wt 165.0 lb

## 2021-03-13 DIAGNOSIS — Z01419 Encounter for gynecological examination (general) (routine) without abnormal findings: Secondary | ICD-10-CM | POA: Diagnosis not present

## 2021-03-13 DIAGNOSIS — Z3009 Encounter for other general counseling and advice on contraception: Secondary | ICD-10-CM | POA: Diagnosis not present

## 2021-03-13 DIAGNOSIS — Z113 Encounter for screening for infections with a predominantly sexual mode of transmission: Secondary | ICD-10-CM

## 2021-03-13 MED ORDER — NORETHINDRONE 0.35 MG PO TABS: 1.0000 | ORAL_TABLET | Freq: Every day | ORAL | 0 refills | Status: DC

## 2021-03-13 NOTE — Patient Instructions (Signed)

## 2021-03-14 LAB — CERVICOVAGINAL ANCILLARY ONLY
Chlamydia: NEGATIVE
Comment: NEGATIVE
Comment: NEGATIVE
Comment: NORMAL
Neisseria Gonorrhea: NEGATIVE
Trichomonas: NEGATIVE

## 2021-03-22 LAB — HCV RNA,QUANTITATIVE REAL TIME PCR
HCV Quantitative Log: <1.18 Log IU/mL
HCV RNA, PCR, QN: <15 IU/mL

## 2021-03-22 LAB — COMPREHENSIVE METABOLIC PANEL
AG Ratio: 1.6 (calc) (ref 1.0–2.5)
ALT: 13 U/L (ref 6–29)
AST: 18 U/L (ref 10–30)
Albumin: 4.4 g/dL (ref 3.6–5.1)
Alkaline phosphatase (APISO): 94 U/L (ref 31–125)
BUN: 15 mg/dL (ref 7–25)
CO2: 24 mmol/L (ref 20–32)
Calcium: 9.8 mg/dL (ref 8.6–10.2)
Chloride: 104 mmol/L (ref 98–110)
Creat: 0.73 mg/dL (ref 0.50–1.10)
Globulin: 2.7 g/dL (calc) (ref 1.9–3.7)
Glucose, Bld: 76 mg/dL (ref 65–99)
Potassium: 4.5 mmol/L (ref 3.5–5.3)
Sodium: 138 mmol/L (ref 135–146)
Total Bilirubin: 0.4 mg/dL (ref 0.2–1.2)
Total Protein: 7.1 g/dL (ref 6.1–8.1)

## 2021-03-22 LAB — CBC
HCT: 43.1 % (ref 35.0–45.0)
Hemoglobin: 14.7 g/dL (ref 11.7–15.5)
MCH: 33.9 pg — ABNORMAL HIGH (ref 27.0–33.0)
MCHC: 34.1 g/dL (ref 32.0–36.0)
MCV: 99.5 fL (ref 80.0–100.0)
MPV: 9.5 fL (ref 7.5–12.5)
Platelets: 350 10*3/uL (ref 140–400)
RBC: 4.33 10*6/uL (ref 3.80–5.10)
RDW: 13.3 % (ref 11.0–15.0)
WBC: 9.9 10*3/uL (ref 3.8–10.8)

## 2021-03-22 LAB — HEPATITIS C ANTIBODY
Hepatitis C Ab: REACTIVE — AB
SIGNAL TO CUT-OFF: 13 — ABNORMAL HIGH (ref <1.00)

## 2021-03-22 LAB — LIPID PANEL
Cholesterol: 196 mg/dL (ref <200)
HDL: 64 mg/dL (ref >=50)
LDL Cholesterol (Calc): 103 mg/dL (calc) — ABNORMAL HIGH
Non-HDL Cholesterol (Calc): 132 mg/dL (calc) — ABNORMAL HIGH (ref <130)
Total CHOL/HDL Ratio: 3.1 (calc) (ref <5.0)
Triglycerides: 177 mg/dL — ABNORMAL HIGH (ref <150)

## 2021-03-22 LAB — RPR: RPR Ser Ql: NONREACTIVE

## 2021-03-22 LAB — HEPATITIS B SURFACE ANTIGEN: Hepatitis B Surface Ag: NONREACTIVE

## 2021-03-22 LAB — HIV ANTIBODY (ROUTINE TESTING W REFLEX): HIV 1&2 Ab, 4th Generation: NONREACTIVE

## 2021-03-24 ENCOUNTER — Encounter: Payer: Self-pay | Admitting: Obstetrics and Gynecology

## 2021-03-25 ENCOUNTER — Encounter: Payer: Self-pay | Admitting: Obstetrics and Gynecology

## 2021-03-25 NOTE — Telephone Encounter (Signed)
Printed note and put on Dr. Elza Rafter desk to let her know patient is anxious.

## 2021-03-26 NOTE — Telephone Encounter (Signed)
Please contact patient regarding her request for Xanax.   I would recommend she contact her provider who is prescribing her Prozac and Xanax or her PCP for this Xanax request.   I will look forward to seeing her in the office tomorrow for her appointment.

## 2021-03-27 ENCOUNTER — Ambulatory Visit: Payer: Medicaid Other | Admitting: Obstetrics and Gynecology

## 2021-03-27 DIAGNOSIS — Z0289 Encounter for other administrative examinations: Secondary | ICD-10-CM

## 2021-05-05 ENCOUNTER — Other Ambulatory Visit: Payer: Self-pay

## 2021-05-27 ENCOUNTER — Other Ambulatory Visit: Payer: Self-pay

## 2021-05-27 ENCOUNTER — Emergency Department (HOSPITAL_COMMUNITY): Payer: Medicaid Other

## 2021-05-27 ENCOUNTER — Encounter (HOSPITAL_COMMUNITY): Payer: Self-pay

## 2021-05-27 ENCOUNTER — Emergency Department (HOSPITAL_COMMUNITY)
Admission: EM | Admit: 2021-05-27 | Discharge: 2021-05-27 | Disposition: A | Discharge status: Home / Self Care | Payer: Medicaid Other | Attending: Emergency Medicine | Admitting: Emergency Medicine

## 2021-05-27 DIAGNOSIS — R569 Unspecified convulsions: Secondary | ICD-10-CM | POA: Diagnosis not present

## 2021-05-27 DIAGNOSIS — Z20822 Contact with and (suspected) exposure to covid-19: Secondary | ICD-10-CM | POA: Diagnosis not present

## 2021-05-27 DIAGNOSIS — Z8616 Personal history of COVID-19: Secondary | ICD-10-CM | POA: Insufficient documentation

## 2021-05-27 DIAGNOSIS — F17210 Nicotine dependence, cigarettes, uncomplicated: Secondary | ICD-10-CM | POA: Diagnosis not present

## 2021-05-27 LAB — COMPREHENSIVE METABOLIC PANEL
ALT: 14 U/L (ref 0–44)
AST: 22 U/L (ref 15–41)
Albumin: 3.4 g/dL — ABNORMAL LOW (ref 3.5–5.0)
Alkaline Phosphatase: 108 U/L (ref 38–126)
Anion gap: 8 (ref 5–15)
BUN: 19 mg/dL (ref 6–20)
CO2: 21 mmol/L — ABNORMAL LOW (ref 22–32)
Calcium: 9 mg/dL (ref 8.9–10.3)
Chloride: 106 mmol/L (ref 98–111)
Creatinine, Ser: 0.79 mg/dL (ref 0.44–1.00)
GFR, Estimated: >60 mL/min (ref >60)
Glucose, Bld: 103 mg/dL — ABNORMAL HIGH (ref 70–99)
Potassium: 4.3 mmol/L (ref 3.5–5.1)
Sodium: 135 mmol/L (ref 135–145)
Total Bilirubin: 0.6 mg/dL (ref 0.3–1.2)
Total Protein: 7 g/dL (ref 6.5–8.1)

## 2021-05-27 LAB — CBC WITH DIFFERENTIAL/PLATELET
Abs Immature Granulocytes: 0.06 10*3/uL (ref 0.00–0.07)
Basophils Absolute: 0.1 10*3/uL (ref 0.0–0.1)
Basophils Relative: 1 %
Eosinophils Absolute: 0.2 10*3/uL (ref 0.0–0.5)
Eosinophils Relative: 2 %
HCT: 40.4 % (ref 36.0–46.0)
Hemoglobin: 13.7 g/dL (ref 12.0–15.0)
Immature Granulocytes: 1 %
Lymphocytes Relative: 21 %
Lymphs Abs: 2.6 10*3/uL (ref 0.7–4.0)
MCH: 32.5 pg (ref 26.0–34.0)
MCHC: 33.9 g/dL (ref 30.0–36.0)
MCV: 95.7 fL (ref 80.0–100.0)
Monocytes Absolute: 0.8 10*3/uL (ref 0.1–1.0)
Monocytes Relative: 6 %
Neutro Abs: 8.8 10*3/uL — ABNORMAL HIGH (ref 1.7–7.7)
Neutrophils Relative %: 69 %
Platelets: 371 10*3/uL (ref 150–400)
RBC: 4.22 MIL/uL (ref 3.87–5.11)
RDW: 12.2 % (ref 11.5–15.5)
WBC: 12.5 10*3/uL — ABNORMAL HIGH (ref 4.0–10.5)
nRBC: 0 % (ref 0.0–0.2)

## 2021-05-27 LAB — I-STAT BETA HCG BLOOD, ED (MC, WL, AP ONLY): I-stat hCG, quantitative: <5 m[IU]/mL (ref <5)

## 2021-05-27 LAB — CBG MONITORING, ED: Glucose-Capillary: 97 mg/dL (ref 70–99)

## 2021-05-27 LAB — SARS CORONAVIRUS 2 (TAT 6-24 HRS): SARS Coronavirus 2: NEGATIVE

## 2021-05-27 LAB — MAGNESIUM: Magnesium: 1.9 mg/dL (ref 1.7–2.4)

## 2021-05-27 LAB — ETHANOL: Alcohol, Ethyl (B): <10 mg/dL (ref <10)

## 2021-05-27 MED ORDER — LEVETIRACETAM IN NACL 1500 MG/100ML IV SOLN
1500.0000 mg | Freq: Once | INTRAVENOUS | Status: AC
  Administered 2021-05-27: 1500 mg via INTRAVENOUS
  Filled 2021-05-27: qty 100

## 2021-05-27 MED ORDER — ALPRAZOLAM 0.5 MG PO TABS: 0.5000 mg | ORAL_TABLET | Freq: Three times a day (TID) | ORAL | 0 refills | Status: DC | PRN

## 2021-05-27 MED ORDER — LORAZEPAM 2 MG/ML IJ SOLN
1.0000 mg | Freq: Once | INTRAMUSCULAR | Status: AC
  Administered 2021-05-27: 1 mg via INTRAVENOUS
  Filled 2021-05-27: qty 1

## 2021-05-27 MED ORDER — LEVETIRACETAM 500 MG PO TABS: 500.0000 mg | ORAL_TABLET | Freq: Two times a day (BID) | ORAL | 2 refills | Status: DC

## 2021-05-27 MED ORDER — KETOROLAC TROMETHAMINE 30 MG/ML IJ SOLN
30.0000 mg | Freq: Once | INTRAMUSCULAR | Status: AC
  Administered 2021-05-27: 30 mg via INTRAVENOUS
  Filled 2021-05-27: qty 1

## 2021-05-27 NOTE — ED Notes (Signed)
Help get patient undressed on the monitor did ekg shown to er provider patient is resting with call bell in reach 

## 2021-05-27 NOTE — ED Notes (Signed)
Pt d/c home per MD order. Discharge summary reviewed with pt, pt verbalizes understanding. No s/s of acute distress noted at discharge. Ambulatory off unit. Pt boyfriend is discharge ride home.

## 2021-05-27 NOTE — Discharge Instructions (Addendum)
You are diagnosed with new seizures today.  We started you on seizure medicine called Keppra.  You should not drive or operate heavy machinery for 6 months, or until you are cleared to go back by a neurologist.  You should avoid swimming at the same time.  You will need a work-up for your seizures as an outpatient.  I placed a referral to the neurologist.  They should contact you within 2 business days.  If you do not hear from them in 2 days, please call the number above and ask for the next available appointment.  You will need an MRI scan of your brain and an EEG done as an outpatient.  We talked about alcohol and benzodiazepine (Xanax) withdrawal.  Both of these can cause seizures.  I think it is very possible that you had a seizure because you stopped taking your Xanax, which you have been taking for a long time.  It is possible to have seizures after stopping Xanax even up to 2 weeks.    Therefore I represcribed your Xanax to have for a few days.  It is extremely important that you get in touch with your  prescriber and talk about this issue.  You should be weaned off of the Xanax over 4-6 weeks by your doctor slowly.  You should not stop cold [place].  They will need to slowly reduce your dose until you are off the medication completely.
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
## 2021-05-27 NOTE — ED Triage Notes (Signed)
Pt arrives via EMS from home, they report she has had a total of 3 seizures today (2 that were witnessed by EMS. BG 91. Pt is currently alert and oriented x4.

## 2021-05-27 NOTE — ED Notes (Signed)
Pt transported to CT ?

## 2021-06-02 ENCOUNTER — Telehealth: Payer: Self-pay

## 2021-06-02 NOTE — Telephone Encounter (Signed)
Patient was in the ER on 05/27/21 with seizures. She said she fell and hit her head on the television and then seizures started.  ER note is in Epic chart.  She said she has never had a seizure before and had started Micronor 3 weeks prior.  She is wondering if Micronor could have been the cause of her seizure and if you think it is okay for her to continue. SHe said it was the only thing she had changed.  I told her since late today I will call her tomorrow and advise.

## 2021-06-02 NOTE — Telephone Encounter (Signed)
Micronor should not cause seizures to occur.   Withdrawal from medication such as Xanax can cause seizures to occur.   She will need to contact her physician who is prescribing her Xanax and Prozac to regulate these medications.  I am not prescribing either of these for her.

## 2021-06-03 NOTE — Telephone Encounter (Signed)
Called and left message with Dr. Elza Rafter reply in voice mail at patient's request.

## 2021-06-11 ENCOUNTER — Ambulatory Visit: Payer: Medicaid Other | Admitting: Diagnostic Neuroimaging

## 2021-06-11 ENCOUNTER — Telehealth: Payer: Self-pay | Admitting: Diagnostic Neuroimaging

## 2021-06-11 ENCOUNTER — Encounter: Payer: Self-pay | Admitting: Diagnostic Neuroimaging

## 2021-06-11 VITALS — BP 140/86 | HR 106 | Ht 64.0 in | Wt 179.0 lb

## 2021-06-11 DIAGNOSIS — F1011 Alcohol abuse, in remission: Secondary | ICD-10-CM | POA: Diagnosis not present

## 2021-06-11 DIAGNOSIS — G40909 Epilepsy, unspecified, not intractable, without status epilepticus: Secondary | ICD-10-CM | POA: Diagnosis not present

## 2021-06-11 MED ORDER — LEVETIRACETAM 500 MG PO TABS: 500.0000 mg | ORAL_TABLET | Freq: Two times a day (BID) | ORAL | 12 refills | Status: DC

## 2021-06-11 NOTE — Patient Instructions (Addendum)
SEIZURE DISORDER (05/27/21 x 2; could be related to left frontal lobe encephalomalacia and TBI in 2019; also chronic alcohol abuse; also intermittent xanax use)  - check MRI brain  - continue levetiracetam '500mg'$  twice a day  - caution with alcohol and xanax; follow up with psychiatry  - According to [facility] law, you can not drive unless you are seizure / syncope free for at least 6 months and under physician's care.  - Please maintain precautions. Do not participate in activities where a loss of awareness could harm you or someone else. No swimming alone, no tub bathing, no hot tubs, no driving, no operating motorized vehicles (cars, ATVs, motocycles, etc), lawnmowers, power tools or firearms. No standing at heights, such as rooftops, ladders or stairs. Avoid hot objects such as stoves, heaters, open fires. Wear a helmet when riding a bicycle, scooter, skateboard, etc. and avoid areas of traffic. Set your water heater to 120 degrees or less.
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
## 2021-06-11 NOTE — Telephone Encounter (Signed)
mcd [facility] community auth: [id number] (exp. 06/11/21 to 07/26/21) order sent to GI. They will reach out to the patient to schedule.

## 2021-06-19 ENCOUNTER — Telehealth: Payer: Self-pay | Admitting: Diagnostic Neuroimaging

## 2021-06-19 NOTE — Telephone Encounter (Signed)
Pt called states she has Family Court needs a letter stating that she experiences severe headaches and that she cannot drive herself so that she continue her court case. Pt provided a fax number [phone] Attention: Myriam Jacobson and Jolyn Nap. Pt states if you have any questions please give her a call.

## 2021-06-19 NOTE — Telephone Encounter (Deleted)
Penumalli, Connie Polka, MD (2:19 PM)   I only informed patient of driving restriction related to seizure.  We can send letter stating she has a 6 month driving restriction; but this may not get her out of her court date. She may want to get advice or letter from her PCP or psychiatry. -VRP

## 2021-06-19 NOTE — Telephone Encounter (Signed)
Letter printed and placed on your desk, please sign before leaving. It needs to be faxed

## 2021-06-19 NOTE — Telephone Encounter (Signed)
I called the pt and advised of Dr. Gladstone Lighter message.  We had an extended conversation and pt is going to print AVS and submit to her lawyer for documentation on the [place] driving law.  I explained to the pt multiple times, PCP or psychiatry could provide an excuse from court if needed. Pt was agreeable to this information and appreciated call back.   Phone staff can reiterate this message if pt call back again because she mentioned several times she has short term memory loss.

## 2021-06-19 NOTE — Telephone Encounter (Signed)
Penumalli, Earlean Polka, MD (4:39 PM)   It is not my decision to excuse patient from court. She may want to discuss with her PCP. -VRP

## 2021-06-19 NOTE — Telephone Encounter (Signed)
Contacted pt back, had a extended conversation with her just trying to get clarity on what she needed the later for. She stated she has been having headaches and want a letter to extend her court date. I informed her that we do not treat her for headaches, she is being seen for seizure disorder. She stated Dr.Penumalli told her she cannot drive for 6 months and can not get herself to court. Also that her seizures are causing her headaches. She needs this letter done by today, I informed her Dr.Penumalli is seeing pt this afternoon and can not promise this will be completed this short of a notice.  She was addiment about getting this letter and wanted me to insure Dr.Penumalli that this is not drug or alcohol related.  Are you willing to write a letter ? Please advise

## 2021-06-23 NOTE — Telephone Encounter (Signed)
Patient left a voicemail on my phone stating that she has not heard about scheduling her MRI.   GI has reached out to the patient 06/13/21 2ND ATTEMPT attempt gna, 1st disconnected & cell vm full//ac 06/12/21 1st attempt gna, 1st disconnected & cell vm full//ac  I called the patient and left her a voicemail informing her to call GI at [phone].

## 2021-07-11 ENCOUNTER — Telehealth: Payer: Self-pay | Admitting: Diagnostic Neuroimaging

## 2021-07-11 NOTE — Telephone Encounter (Signed)
Pt called states she has a scheduled MRI on tomorrow wanting to know if she can have something called in to help her relax.

## 2021-07-11 NOTE — Telephone Encounter (Signed)
Pt's mother Shauna Hugh called stating daughter needs a letter for [employer] in the letter is needs to state what she is diagnosed with and the medications that were prescribed, doe snot want the letter to go into detail just short and sweet. Pt's mother requesting a call back.

## 2021-07-12 ENCOUNTER — Other Ambulatory Visit: Payer: Self-pay

## 2021-07-12 ENCOUNTER — Ambulatory Visit
Admission: RE | Admit: 2021-07-12 | Discharge: 2021-07-12 | Disposition: A | Discharge status: Home / Self Care | Payer: Medicaid Other | Source: Ambulatory Visit | Attending: Diagnostic Neuroimaging | Admitting: Diagnostic Neuroimaging

## 2021-07-12 DIAGNOSIS — G40909 Epilepsy, unspecified, not intractable, without status epilepticus: Secondary | ICD-10-CM

## 2021-07-12 MED ORDER — GADOBENATE DIMEGLUMINE 529 MG/ML IV SOLN
17.0000 mL | Freq: Once | INTRAVENOUS | Status: AC | PRN
  Administered 2021-07-12: 17 mL via INTRAVENOUS

## 2021-07-14 NOTE — Telephone Encounter (Signed)
Mychart message send in regards to this.

## 2021-07-14 NOTE — Telephone Encounter (Signed)
Rx was not called in on time since call was on Friday. Pt had MRI as scheduled.

## 2021-07-15 ENCOUNTER — Ambulatory Visit
Admission: RE | Admit: 2021-07-15 | Discharge: 2021-07-15 | Disposition: A | Discharge status: Home / Self Care | Payer: Medicaid Other | Source: Ambulatory Visit | Attending: Registered Nurse | Admitting: Registered Nurse

## 2021-07-15 ENCOUNTER — Other Ambulatory Visit: Payer: Self-pay | Admitting: Registered Nurse

## 2021-07-15 ENCOUNTER — Other Ambulatory Visit: Payer: Self-pay

## 2021-07-15 DIAGNOSIS — M25511 Pain in right shoulder: Secondary | ICD-10-CM

## 2021-07-15 NOTE — Telephone Encounter (Addendum)
I have tried to reach out to the pt about the letter via my chart. She I had an extended conversation On 06/19/21 about this  I called the pt and advised of Dr. Gladstone Lighter message.   We had an extended conversation and pt is going to print AVS and submit to her lawyer for documentation on the [place] driving law.   I explained to the pt multiple times, PCP or psychiatry could provide an excuse from court if needed. Pt was agreeable to this information and appreciated call back.    Phone staff can reiterate this message if pt call back again because she mentioned several times she has short term memory loss.  Dr. Leta Baptist advised then if this letter is an excuse from court he cannot provide. The AVS from the visit as the diagnosis and the medications she is prescribed. This should be sufficient.   PHONE STAFF If pt's mom calls back please relay we have sent a my chart message on this and Dr. Annell Greening advised the previous time still stands.

## 2021-07-16 ENCOUNTER — Telehealth: Payer: Self-pay | Admitting: Diagnostic Neuroimaging

## 2021-07-16 NOTE — Telephone Encounter (Signed)
Pt asked it be added to her note that her headaches are more intense and that her sister informed her that in her sleep she was a grey color and that it was very difficult to wake pt.  Please call.

## 2021-07-16 NOTE — Telephone Encounter (Signed)
Pt called asking for a call back to go over her results of her MRI.

## 2021-07-16 NOTE — Telephone Encounter (Signed)
Have requested work in doctor to review during Dr. Gladstone Lighter absence. Will update pt accordingly.

## 2021-07-17 NOTE — Telephone Encounter (Signed)
I called pt back and advised we had received the message about her MRI result. I advised Dr. Leta Baptist is out of the office today and will be back on Monday and her request was sent to covering MD during his absence.  Pt was advised I would talk with Dr. Domenica Fail on Monday on the increased frequency noted in her headache. Pt reports she has not noticed a pattern to her headaches but typically are located in the left front side. She is taking OTC meds with some relief but wanted to know if a prescription could be tried.   I advised I would ask on Monday and then get back with her once I know the results of the MRI.

## 2021-07-21 ENCOUNTER — Telehealth: Payer: Self-pay

## 2021-07-21 NOTE — Telephone Encounter (Signed)
-----   Message from Penni Bombard, MD sent at 07/21/2021 12:20 PM EDT ----- Stable results. No new findings. -VRP

## 2021-07-21 NOTE — Telephone Encounter (Signed)
Pt would like to know if any medications could be offered for her headaches? She reports daily headaches that range in severity she has not noticed a pattern to her headaches. Pt reports otc measures not effective in treating her headaches.   To note she has been told by her friends/family she snores frequently and appears to stop breathing at times.

## 2021-07-28 NOTE — Telephone Encounter (Signed)
Penumalli, Earlean Polka, MD  You 1 hour ago (1:36 PM)   May setup mychart visit to discuss HA options. Also may consider sleep study referral. -VRP   I called the pt and left a vm advising of recommendation from provider.  Pt advised to cb and schedule appt.   Phone staff can assist with this if pt is agreeable.

## 2021-08-04 ENCOUNTER — Telehealth: Payer: Self-pay | Admitting: Diagnostic Neuroimaging

## 2021-08-04 NOTE — Telephone Encounter (Signed)
I called the pt's mom back ( ok per dpr).  She sts she is in need of a paper copy of the last office notes from Dr. Leta Baptist. I have printed and placed up front for pick up.

## 2021-08-04 NOTE — Telephone Encounter (Signed)
Pt's mother called asking if the letter they needed is ready to be picked up. Pt's mother requesting a call back.

## 2021-08-07 ENCOUNTER — Institutional Professional Consult (permissible substitution): Payer: Medicaid Other | Admitting: Emergency Medicine

## 2021-12-17 ENCOUNTER — Ambulatory Visit: Payer: Medicaid Other | Admitting: Family Medicine

## 2021-12-17 ENCOUNTER — Encounter: Payer: Self-pay | Admitting: Family Medicine

## 2021-12-17 NOTE — Patient Instructions (Incomplete)
Below is our plan:  We will ***  Please make sure you are consistent with timing of seizure medication. I recommend annual visit with primary care provider (PCP) for complete physical and routine blood work. We will monitor vitamin D level. I recommend daily intake of vitamin D (400-800iu) and calcium (800-1000mg ) for bone health. Discuss Dexa screening with PCP.   According to [place] law, you can not drive unless you are seizure / syncope free for at least 6 months and under physician's care.  Please maintain precautions. Do not participate in activities where a loss of awareness could harm you or someone else. No swimming alone, no tub bathing, no hot tubs, no driving, no operating motorized vehicles (cars, ATVs, motocycles, etc), lawnmowers, power tools or firearms. No standing at heights, such as rooftops, ladders or stairs. Avoid hot objects such as stoves, heaters, open fires. Wear a helmet when riding a bicycle, scooter, skateboard, etc. and avoid areas of traffic. Set your water heater to 120 degrees or less.    Please make sure you are staying well hydrated. I recommend 50-60 ounces daily. Well balanced diet and regular exercise encouraged. Consistent sleep schedule with 6-8 hours recommended.   Please continue follow up with care team as directed.   Follow up with *** in ***  You may receive a survey regarding today's visit. I encourage you to leave honest feed back as I do use this information to improve patient care. Thank you for seeing me today!
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
## 2022-05-05 ENCOUNTER — Telehealth: Payer: Self-pay | Admitting: Diagnostic Neuroimaging

## 2022-05-05 NOTE — Telephone Encounter (Signed)
Pt is calling because she wants someone to call and explain the MRI results to her.

## 2022-05-06 ENCOUNTER — Encounter: Payer: Self-pay | Admitting: Diagnostic Neuroimaging

## 2022-05-06 NOTE — Telephone Encounter (Signed)
Called patient who asked multiple questions about 2022 MRI which was compared to CT scans. She's  concerned about having more seizures and the "white matter" on her MRI. She had one seizure since last seen here Aug 2022; thinks it was in Oct. She di dnot miss any doses, was not sick. She did not go to ED.  She no showed FU in Feb; we scheduled her for FU, put her on wait list. She has enough levetiracetam for next 2 weeks. Patient verbalized understanding, appreciation.

## 2022-05-26 ENCOUNTER — Encounter: Payer: Self-pay | Admitting: Diagnostic Neuroimaging

## 2022-05-26 ENCOUNTER — Ambulatory Visit: Payer: Medicaid Other | Admitting: Diagnostic Neuroimaging

## 2022-08-19 ENCOUNTER — Institutional Professional Consult (permissible substitution): Payer: Medicaid Other | Admitting: Pulmonary Disease

## 2022-08-19 NOTE — Progress Notes (Deleted)
Synopsis: Referred in *** for *** by Gregor Hams, FNP  Subjective:   PATIENT ID: Connie Ser GENDER: female DOB: 1981-10-06, MRN: [id number]  No chief complaint on file.   HPI  ***  Past Medical History:  Diagnosis Date   ADD (attention deficit disorder)    Anxiety and depression    COVID 10/2019   Ectopic pregnancy 2006   treated with MTX.   ETOH abuse    Hx MRSA infection    Pulmonary nodule    Seizures ([place])    most recent 10/22     Family History  Problem Relation Age of Onset   Hypertension Mother    Hyperlipidemia Father    Hyperlipidemia Sister    Hypertension Sister    Diabetes Sister    Breast cancer Maternal Grandmother    Heart disease Maternal Grandfather    Hyperlipidemia Maternal Grandfather    Hypertension Maternal Grandfather    Stroke Paternal Grandmother    Kidney disease Paternal Grandmother      Past Surgical History:  Procedure Laterality Date   BUNIONECTOMY     2009    Social History   Socioeconomic History   Marital status: Legally Separated    Spouse name: Not on file   Number of children: 3   Years of education: Not on file   Highest education level: Not on file  Occupational History   Occupation: homemaker  Tobacco Use   Smoking status: Every Day    Packs/day: 0.50    Years: 10.00    Total pack years: 5.00    Types: Cigarettes   Smokeless tobacco: Never  Vaping Use   Vaping Use: Former  Substance and Sexual Activity   Alcohol use: Yes    Alcohol/week: 2.0 standard drinks of alcohol    Types: 2 Glasses of wine per week    Comment: in rehab 2019, 05/06/22 occas glass of wine   Drug use: Yes    Types: Marijuana   Sexual activity: Yes    Partners: Male    Birth control/protection: None  Other Topics Concern   Not on file  Social History Narrative   Right handed    Caffine- 6 pack of coke per day    Lives with her sister    Social Determinants of Health   Financial Resource Strain: Not on file  Food  Insecurity: Not on file  Transportation Needs: Not on file  Physical Activity: Not on file  Stress: Not on file  Social Connections: Not on file  Intimate Partner Violence: Not on file     Allergies  Allergen Reactions   Gadolinium Derivatives     Pt sneezed immediately after contrast admin and then again about 1 min later. Said she could finish exam & post said throat was scratchy. Dr. Janeece Fitting eval and had her wait for 30 minutes to monitor symptoms. swelling   Doxycycline Hyclate Other (See Comments)    Feels strange   Phenergan [Promethazine Hcl] Other (See Comments)    Does not work      Outpatient Medications Prior to Visit  Medication Sig Dispense Refill   acetaminophen (TYLENOL) 500 MG tablet Take 500 mg by mouth every 6 (six) hours as needed.     FLUoxetine (PROZAC) 40 MG capsule Take by mouth.     levETIRAcetam (KEPPRA) 500 MG tablet Take 1 tablet (500 mg total) by mouth 2 (two) times daily. 60 tablet 12   norethindrone (MICRONOR) 0.35 MG tablet Take 1  tablet (0.35 mg total) by mouth daily. 28 tablet 0   No facility-administered medications prior to visit.    ROS   Objective:  Physical Exam   There were no vitals filed for this visit.   on *** LPM *** RA BMI Readings from Last 3 Encounters:  06/11/21 30.73 kg/m  05/27/21 29.18 kg/m  03/13/21 27.88 kg/m   Wt Readings from Last 3 Encounters:  06/11/21 179 lb (81.2 kg)  05/27/21 170 lb (77.1 kg)  03/13/21 165 lb (74.8 kg)     CBC    Component Value Date/Time   WBC 12.5 (H) 05/27/2021 0956   RBC 4.22 05/27/2021 0956   HGB 13.7 05/27/2021 0956   HGB 13.5 07/13/2014 1119   HCT 40.4 05/27/2021 0956   PLT 371 05/27/2021 0956   MCV 95.7 05/27/2021 0956   MCH 32.5 05/27/2021 0956   MCHC 33.9 05/27/2021 0956   RDW 12.2 05/27/2021 0956   LYMPHSABS 2.6 05/27/2021 0956   MONOABS 0.8 05/27/2021 0956   EOSABS 0.2 05/27/2021 0956   BASOSABS 0.1 05/27/2021 0956    ***  Chest  Imaging: ***  Pulmonary Functions Testing Results:     No data to display          FeNO: ***  Pathology: ***  Echocardiogram: ***  Heart Catheterization: ***    Assessment & Plan:   No diagnosis found.  Discussion: ***   Current Outpatient Medications:    acetaminophen (TYLENOL) 500 MG tablet, Take 500 mg by mouth every 6 (six) hours as needed., Disp: , Rfl:    FLUoxetine (PROZAC) 40 MG capsule, Take by mouth., Disp: , Rfl:    levETIRAcetam (KEPPRA) 500 MG tablet, Take 1 tablet (500 mg total) by mouth 2 (two) times daily., Disp: 60 tablet, Rfl: 12   norethindrone (MICRONOR) 0.35 MG tablet, Take 1 tablet (0.35 mg total) by mouth daily., Disp: 28 tablet, Rfl: 0  I spent *** minutes dedicated to the care of this patient on the date of this encounter to include pre-visit review of records, face-to-face time with the patient discussing conditions above, post visit ordering of testing, clinical documentation with the electronic health record, making appropriate referrals as documented, and communicating necessary findings to members of the patients care team.   Garner Nash, DO [facility] 08/19/2022 1:06 PM

## 2022-08-30 IMAGING — DX DG CHEST 1V PORT
1 series · 1 of 1 positions shown · non-contrast
Comparison: Chest x-ray dated July 31, 2019

CLINICAL DATA: Seizure

EXAM:
PORTABLE CHEST 1 VIEW

[chest]
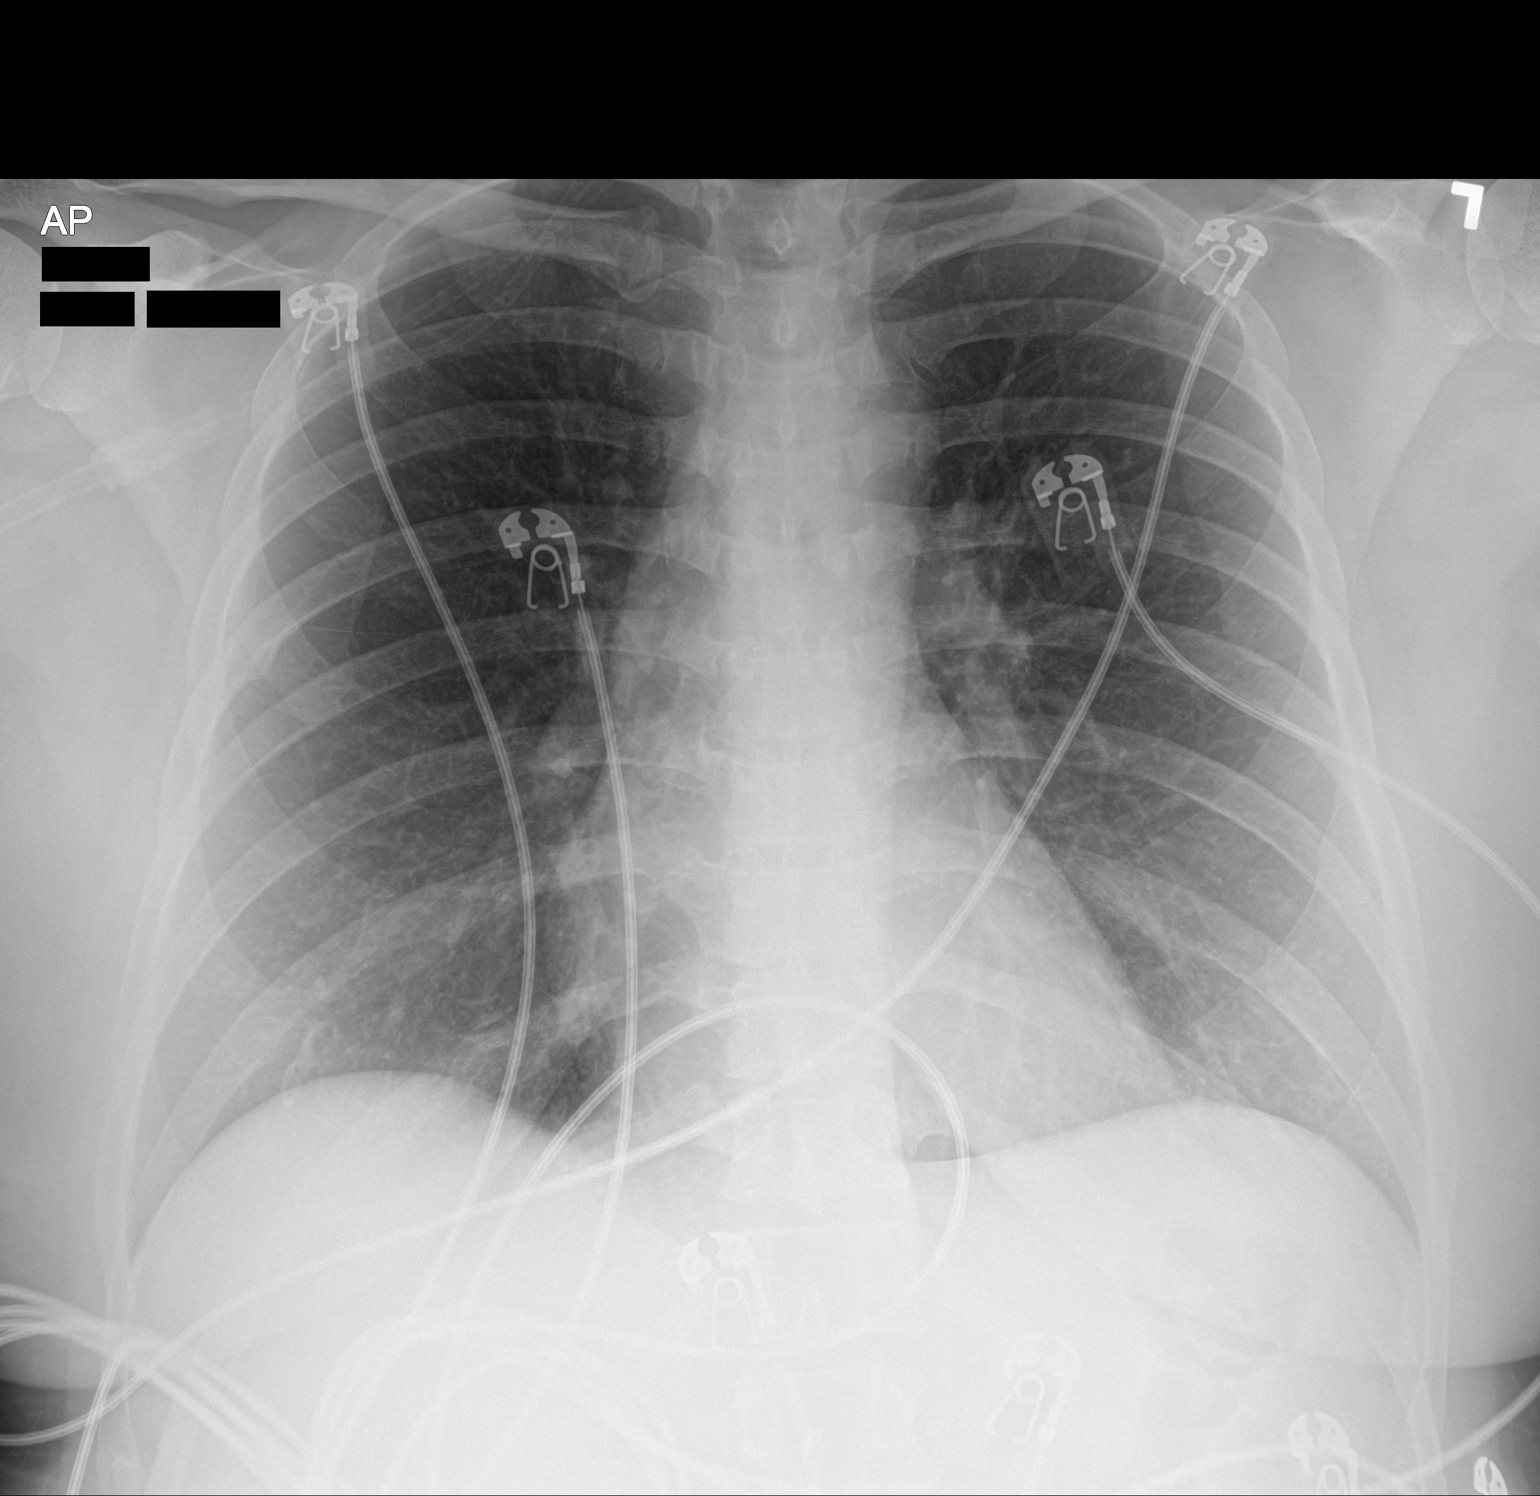

[1 of 1 positions shown; findings below may reference images not displayed]

FINDINGS: Cardiac and mediastinal contours are unchanged within normal limits.

New linear nodular opacity the right lung base. Lungs otherwise
clear. No pleural abnormalities.
IMPRESSION: No acute cardiopulmonary abnormality.

New linear nodular opacity the right lung base, possibly
atelectasis, although a pulmonary nodule cannot be excluded.
Recommend PA and lateral chest x-ray for further assessment.

## 2023-04-02 ENCOUNTER — Other Ambulatory Visit: Payer: Self-pay

## 2023-04-02 ENCOUNTER — Emergency Department (HOSPITAL_COMMUNITY): Payer: Medicaid Other

## 2023-04-02 ENCOUNTER — Emergency Department (HOSPITAL_COMMUNITY)
Admission: EM | Admit: 2023-04-02 | Discharge: 2023-04-02 | Disposition: A | Discharge status: Home / Self Care | Payer: Medicaid Other | Attending: Emergency Medicine | Admitting: Emergency Medicine

## 2023-04-02 DIAGNOSIS — E876 Hypokalemia: Secondary | ICD-10-CM | POA: Insufficient documentation

## 2023-04-02 DIAGNOSIS — Z23 Encounter for immunization: Secondary | ICD-10-CM | POA: Insufficient documentation

## 2023-04-02 DIAGNOSIS — S0990XA Unspecified injury of head, initial encounter: Secondary | ICD-10-CM

## 2023-04-02 DIAGNOSIS — S0181XA Laceration without foreign body of other part of head, initial encounter: Secondary | ICD-10-CM | POA: Diagnosis not present

## 2023-04-02 LAB — BASIC METABOLIC PANEL
Anion gap: 15 (ref 5–15)
BUN: 13 mg/dL (ref 6–20)
CO2: 23 mmol/L (ref 22–32)
Calcium: 9 mg/dL (ref 8.9–10.3)
Chloride: 99 mmol/L (ref 98–111)
Creatinine, Ser: 0.91 mg/dL (ref 0.44–1.00)
GFR, Estimated: >60 mL/min (ref >60)
Glucose, Bld: 122 mg/dL — ABNORMAL HIGH (ref 70–99)
Potassium: 2.9 mmol/L — ABNORMAL LOW (ref 3.5–5.1)
Sodium: 137 mmol/L (ref 135–145)

## 2023-04-02 LAB — CBC WITH DIFFERENTIAL/PLATELET
Abs Immature Granulocytes: 0.05 10*3/uL (ref 0.00–0.07)
Basophils Absolute: 0 10*3/uL (ref 0.0–0.1)
Basophils Relative: 0 %
Eosinophils Absolute: 0.2 10*3/uL (ref 0.0–0.5)
Eosinophils Relative: 2 %
HCT: 35.7 % — ABNORMAL LOW (ref 36.0–46.0)
Hemoglobin: 12.2 g/dL (ref 12.0–15.0)
Immature Granulocytes: 1 %
Lymphocytes Relative: 26 %
Lymphs Abs: 2.7 10*3/uL (ref 0.7–4.0)
MCH: 33.1 pg (ref 26.0–34.0)
MCHC: 34.2 g/dL (ref 30.0–36.0)
MCV: 96.7 fL (ref 80.0–100.0)
Monocytes Absolute: 0.7 10*3/uL (ref 0.1–1.0)
Monocytes Relative: 7 %
Neutro Abs: 6.8 10*3/uL (ref 1.7–7.7)
Neutrophils Relative %: 64 %
Platelets: 225 10*3/uL (ref 150–400)
RBC: 3.69 MIL/uL — ABNORMAL LOW (ref 3.87–5.11)
RDW: 12.5 % (ref 11.5–15.5)
WBC: 10.5 10*3/uL (ref 4.0–10.5)
nRBC: 0 % (ref 0.0–0.2)

## 2023-04-02 LAB — I-STAT BETA HCG BLOOD, ED (MC, WL, AP ONLY): I-stat hCG, quantitative: <5 m[IU]/mL (ref <5)

## 2023-04-02 LAB — I-STAT CHEM 8, ED
BUN: 15 mg/dL (ref 6–20)
Calcium, Ion: 1.06 mmol/L — ABNORMAL LOW (ref 1.15–1.40)
Chloride: 103 mmol/L (ref 98–111)
Creatinine, Ser: 0.9 mg/dL (ref 0.44–1.00)
Glucose, Bld: 119 mg/dL — ABNORMAL HIGH (ref 70–99)
HCT: 37 % (ref 36.0–46.0)
Hemoglobin: 12.6 g/dL (ref 12.0–15.0)
Potassium: 3 mmol/L — ABNORMAL LOW (ref 3.5–5.1)
Sodium: 138 mmol/L (ref 135–145)
TCO2: 26 mmol/L (ref 22–32)

## 2023-04-02 LAB — ETHANOL: Alcohol, Ethyl (B): <10 mg/dL (ref <10)

## 2023-04-02 MED ORDER — ONDANSETRON HCL 4 MG PO TABS: 4.0000 mg | ORAL_TABLET | Freq: Four times a day (QID) | ORAL | 0 refills | Status: DC

## 2023-04-02 MED ORDER — HYDROCODONE-ACETAMINOPHEN 5-325 MG PO TABS
1.0000 | ORAL_TABLET | Freq: Once | ORAL | Status: AC
  Administered 2023-04-02: 1 via ORAL
  Filled 2023-04-02: qty 1

## 2023-04-02 MED ORDER — POTASSIUM CHLORIDE CRYS ER 20 MEQ PO TBCR
40.0000 meq | EXTENDED_RELEASE_TABLET | Freq: Once | ORAL | Status: AC
  Administered 2023-04-02: 40 meq via ORAL
  Filled 2023-04-02: qty 2

## 2023-04-02 MED ORDER — HYDROCODONE-ACETAMINOPHEN 5-325 MG PO TABS: 1.0000 | ORAL_TABLET | Freq: Four times a day (QID) | ORAL | 0 refills | Status: DC | PRN

## 2023-04-02 MED ORDER — TETANUS-DIPHTH-ACELL PERTUSSIS 5-2.5-18.5 LF-MCG/0.5 IM SUSY
0.5000 mL | PREFILLED_SYRINGE | Freq: Once | INTRAMUSCULAR | Status: AC
  Administered 2023-04-02: 0.5 mL via INTRAMUSCULAR
  Filled 2023-04-02: qty 0.5

## 2023-04-02 MED ORDER — ONDANSETRON 4 MG PO TBDP
4.0000 mg | ORAL_TABLET | Freq: Once | ORAL | Status: AC
  Administered 2023-04-02: 4 mg via ORAL
  Filled 2023-04-02: qty 1
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
## 2023-04-02 NOTE — Discharge Instructions (Signed)
Return for any problem.  ?

## 2023-05-26 ENCOUNTER — Encounter (HOSPITAL_BASED_OUTPATIENT_CLINIC_OR_DEPARTMENT_OTHER): Payer: Self-pay | Admitting: Emergency Medicine

## 2023-05-26 ENCOUNTER — Emergency Department (HOSPITAL_BASED_OUTPATIENT_CLINIC_OR_DEPARTMENT_OTHER)
Admission: EM | Admit: 2023-05-26 | Discharge: 2023-05-26 | Discharge status: Against Medical Advice | Payer: Medicaid Other | Attending: Emergency Medicine | Admitting: Emergency Medicine

## 2023-05-26 DIAGNOSIS — R519 Headache, unspecified: Secondary | ICD-10-CM | POA: Diagnosis present

## 2023-05-26 DIAGNOSIS — R059 Cough, unspecified: Secondary | ICD-10-CM | POA: Insufficient documentation

## 2023-05-26 DIAGNOSIS — Z5321 Procedure and treatment not carried out due to patient leaving prior to being seen by health care provider: Secondary | ICD-10-CM | POA: Insufficient documentation

## 2023-05-26 DIAGNOSIS — Z1152 Encounter for screening for COVID-19: Secondary | ICD-10-CM | POA: Insufficient documentation

## 2023-05-26 LAB — RESP PANEL BY RT-PCR (RSV, FLU A&B, COVID)  RVPGX2
Influenza A by PCR: NEGATIVE
Influenza B by PCR: NEGATIVE
Resp Syncytial Virus by PCR: NEGATIVE
SARS Coronavirus 2 by RT PCR: NEGATIVE

## 2023-05-26 LAB — GROUP A STREP BY PCR: Group A Strep by PCR: NOT DETECTED

## 2023-05-26 NOTE — ED Triage Notes (Signed)
Headache and cough since Thursday. Delsom helped with symptoms

## 2023-08-30 ENCOUNTER — Encounter: Payer: Self-pay | Admitting: Diagnostic Neuroimaging

## 2023-08-30 ENCOUNTER — Ambulatory Visit (INDEPENDENT_AMBULATORY_CARE_PROVIDER_SITE_OTHER): Payer: Medicaid Other | Admitting: Diagnostic Neuroimaging

## 2023-08-30 VITALS — BP 118/80 | HR 99 | Ht 64.0 in | Wt 168.0 lb

## 2023-08-30 DIAGNOSIS — G40909 Epilepsy, unspecified, not intractable, without status epilepticus: Secondary | ICD-10-CM | POA: Diagnosis not present
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
## 2023-09-01 NOTE — Patient Instructions (Signed)
SEIZURE DISORDER (05/27/21 x 2; could be related to left frontal lobe encephalomalacia and TBI in 2019; also chronic alcohol abuse; also intermittent xanax use; last event 05/27/21) - has been seizure free off meds since Aug 2022; will hold off on anti-seizure meds for now - if any future seizures occur, then may consider to restart levetiracetam

## 2024-03-27 NOTE — Progress Notes (Signed)
 [address] - AMBULATORY [facility]  - [facility]RG[facility] [place] [address] [place] [id number]  History of Present Illness   History given by patient  History of Present Illness The patient is a 43 year old female who presents for evaluation of a foot laceration.  She sustained a laceration on her foot, which she attributes to scraping her foot against either carpet nails or an air vent cover in her residence. The incident occurred at 7:00 PM the previous night, resulting in significant bleeding that persisted until 9:00 PM. She reports no foreign body sensation in the wound. Initially, she experienced numbness and tingling, which she ascribes to shock; however, these symptoms have since resolved. She retains full range of motion and is able to ambulate without difficulty. The wound is located on the plantar aspect of the midfoot. She has been managing the wound with peroxide and Hibiclens. She presents with dressing on wound.   Denies numbness/tingling, loss of function, fever, chills, night sweats, chest pain, palpitations, irregular heartbeat, shortness of breath, or GI symptoms such as nausea, vomiting, or diarrhea.   She requested a pregnancy test.  IMMUNIZATIONS She is up to date on her tetanus vaccine.  Patient has no co-morbidities  or medications that might increase the risk for developing a severe infection   Past Medical History:  Diagnosis Date  . Anxiety   . Panic attack     Review of Systems   Review of Systems   Negative except per HPI noted above.   Physicial Exam   Vitals:   03/27/24 0849  BP: 106/72  Pulse: 98  Resp: 17  Temp: 97.8 F (36.6 C)  SpO2: 100%     Physical Exam Vitals and nursing note reviewed.  Constitutional:      General: She is not in acute distress.    Appearance: Normal appearance. She is not ill-appearing, toxic-appearing or diaphoretic.  HENT:     Head: Normocephalic and atraumatic.   Eyes:     Extraocular Movements: Extraocular movements intact.  Cardiovascular:     Rate and Rhythm: Normal rate and regular rhythm.     Pulses: Normal pulses.     Heart sounds: Normal heart sounds.  Pulmonary:     Effort: Pulmonary effort is normal.     Breath sounds: Normal breath sounds.  Musculoskeletal:        General: Normal range of motion.     Cervical back: Normal range of motion and neck supple.  Skin:    General: Skin is warm and dry.     Capillary Refill: Capillary refill takes less than 2 seconds.     Findings: Laceration and wound present.     Comments: See image of R foot  Neurological:     General: No focal deficit present.     Mental Status: She is alert and oriented to person, place, and time.     Gait: Gait is intact.  Psychiatric:        Mood and Affect: Mood normal.        Behavior: Behavior is cooperative.         Laceration of affected site; see image  Laceration repair  Date/Time: 03/27/2024 8:40 AM  Performed by: Elvira Dublin, PA-C Authorized by: Elvira Dublin, PA-C   Consent:    Consent obtained:  Verbal   Consent given by:  Patient   Risks discussed:  Infection and pain Universal protocol:    Patient identity confirmed:  Verbally with patient Anesthesia:    Anesthesia method:  None Laceration details:    Location:  Foot   Foot location:  Sole of R foot   Length (cm):  3 Exploration:    Imaging outcome: foreign body not noted     Wound exploration: wound explored through full range of motion and entire depth of wound visualized     Wound extent: no foreign bodies/material noted, no muscle damage noted, no nerve damage noted, no tendon damage noted, no underlying fracture noted and no vascular damage noted     Contaminated: no   Treatment:    Area cleansed with:  Chlorhexidine and povidone-iodine   Amount of cleaning:  Standard   Irrigation solution:  Sterile water   Irrigation method:  Syringe   Debridement:  Moderate    Undermining:  None Skin repair:    Repair method:  Steri-Strips and tissue adhesive   Number of Steri-Strips:  2 Post-procedure details:    Dressing:  Non-adherent dressing   Procedure completion:  Tolerated well, no immediate complications     Diagnosis   Calyssa was seen today for foot pain.  Diagnoses and all orders for this visit:  Right foot pain -     ondansetron  (ZOFRAN -ODT) disintegrating tablet 4 mg -     ketorolac  (TORADOL ) injection 15 mg -     Amb DME R foot post op shoe  Injury -     Amb DME R foot post op shoe     Medical Decision Making Heela Heishman is a 43 y.o. female who presents to urgent care today with complaints of  Chief Complaint  Patient presents with  . Foot Pain    Started last night, ran foot across air vent and 4 prongs went into the foot.  Took tylenol  Last tdap was 04/02/2023 She would like a pregnancy test Complaining of tingling down left arm intermittently and says its because the dog pulls on her arm?    Wound inspected under direct bright light with good visualization. Area with ~ 3 cm laceration across soft tissue through adipose without exposure of muscle belly or tendon.  Full strength with flexion and extension, no concern for tendon involvement.  No overt foreign body. Area hemostatic.  Neurovascular exam congruent with above.  Tetanus immunization is up-to-date. Pt given Toradol  and Zofran  for pain management. May use Tylenol  or Motrin at home PRN. Area extensively irrigated with sterile normal saline under pressure. Pt declined laceration repair with sutures and x-ray to rule out foreign body, although low risk as laceration is superficial. No foreign body observed on exam. After irrigating and disinfecting area, applied tissue adhesive and large steri strips. Area covered with non-adhesive dressing and wrapped with coban. Patient tolerated procedure well and neurovascular exam intact and unchanged post repair with intact distal pulses and cap  refill.  Wound care discussed.  We discussed signs and symptoms to monitor for that would indicate infection such as fever, swollen lymph nodes, increased redness, red streaks, increased warmth, increased swelling, odor from wound, or purulent discharge. Return precautions discussed w/ full understanding. Pt discharged with post op shoe.   Per patient request, urine hcg performed. Results negative.    Discharge Information  Symptomatic management discussed.  Patient was given verbal and written instructions on symptoms that necessitate return to the UC/ED, and instructed to f/u w/ UC or PCP if not improving in expected timeframe.   2. Patient/parent has been instructed on RX/OTC medications, dosages, side effects,  and possible interactions as associated with each diagnosis in my impression and plan  above.   3. Patient education (verbal/handout) given on diagnosis, pathophysiology, treatment of diagnosis, side effects of medication use for treatment, restrictions while taking medication, supportives measures such as staying hydrated.   4. Red Flags associated with diagnosis/es were reviewed and patient instructed on action plan if red flags develop. They have been instructed that if symptoms worsen or red flags develop they should return to Urgent Care, go to the nearest ED, or activate EMS/911.     5. Patient and/or parent/guardian (if applicable) agreed with plan and voiced understanding.  No barriers to adherence perceived by myself.  Portions of this note may have been dictated using Dragon dictation software/hardware and may contain grammatical or spelling errors.    If a new prescription was given today, then I discussed potential side effects, drug interactions, instructions for taking the medication, and the consequences of not taking it.    F/u: Follow up closely with primary care provider (PCP) and other specialists for further care and routine care, but seek medical attention sooner  if worsening/concerning signs or symptoms.  Follow up with PCP   Electronically signed by: Elvira Dublin, PA-C 03/27/2024 9:54 AM

## 2024-05-30 ENCOUNTER — Ambulatory Visit (HOSPITAL_COMMUNITY)
Admission: EM | Admit: 2024-05-30 | Discharge: 2024-05-30 | Disposition: A | Discharge status: Home / Self Care | Attending: Nurse Practitioner | Admitting: Nurse Practitioner

## 2024-05-30 DIAGNOSIS — R451 Restlessness and agitation: Secondary | ICD-10-CM | POA: Insufficient documentation

## 2024-05-30 DIAGNOSIS — R569 Unspecified convulsions: Secondary | ICD-10-CM | POA: Insufficient documentation

## 2024-05-30 DIAGNOSIS — F411 Generalized anxiety disorder: Secondary | ICD-10-CM | POA: Insufficient documentation

## 2024-05-30 DIAGNOSIS — R918 Other nonspecific abnormal finding of lung field: Secondary | ICD-10-CM | POA: Insufficient documentation

## 2024-05-30 DIAGNOSIS — F1090 Alcohol use, unspecified, uncomplicated: Secondary | ICD-10-CM | POA: Insufficient documentation

## 2024-05-30 DIAGNOSIS — Z653 Problems related to other legal circumstances: Secondary | ICD-10-CM | POA: Insufficient documentation

## 2024-05-30 DIAGNOSIS — Z7952 Long term (current) use of systemic steroids: Secondary | ICD-10-CM | POA: Insufficient documentation

## 2024-05-30 MED ORDER — HYDROXYZINE HCL 25 MG PO TABS: 25.0000 mg | ORAL_TABLET | Freq: Three times a day (TID) | ORAL | 0 refills | Status: AC | PRN

## 2024-05-30 NOTE — ED Provider Notes (Signed)
 Behavioral Health Urgent Care Medical Screening Exam  Patient Name: Connie Reed MRN: [id number] Date of Evaluation: 05/30/24 Chief Complaint:  worsening anxiety symptoms Diagnosis:  Final diagnoses:  GAD (generalized anxiety disorder)   History of Present illness: Connie Reed is a 43 y.o. female who presented to the [facility] with complaints of worsening anxiety symptoms.  Assessment & Review of Psychiatry Symptoms: During encounter, pt reports that her anxiety has worsened in the past at least 6 months on the context of a custody battle with her husband related to her children who are ages 54 and 100 yrs old. Pt reports restlessness, excessive worrying, poor concentration levels as well as muscle tension for at least the past 6 months, states that her husband has had custody of her children for the past 1.5 yrs now. She reports that she has been drinking 2 glasses of wine nightly in an effort to self medicated her anxiety, shares that she is also on some Prednisone that was recently prescribed by her Pulmonologist for management of her lung nodules. 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 Denies that seizures in the past were related to her alcohol use, denies any history of blackouts.  Patient denies depressive symptoms, states she has been sleeping excessively, denies feelings of guilt & other symptoms significant for depression. She denies a history of suicide attempts, reports only mental health condition as being anxiety, denies being hospitalized in the past for mental health related reasons, denies being on any psychotropic medications. Presents with an anxious mood, attention to personal hygiene and grooming is fair, eye contact is good, speech is clear & coherent. Thought contents are organized and logical, and pt  currently denies SI/HI/AVH or paranoia. There is no evidence of delusional thoughts. There are no overt signs of psychosis.  We talked about outpatient management of her anxiety symptoms and on the need to establish outpatient care for continuity of care. Patient was receptive to this, states that she resides in [facility], and agreeable to coming back here to this location tomorrow, 05/31/2024 to follow up at the open access clinic upstairs. Discharged in no acute distress. Provided with a script for Hydroxyzine  25 ng TID for anxiety x 15 tabs with no refills, and will need to f/u at the outpatient clinic here at the [facility] for continuity of care for med management and therapy svs.   Flowsheet Row ED from 05/30/2024 in [facility] ED from 05/26/2023 in [facility] Emergency Department at [facility] ED from 05/27/2021 in [facility] at [facility]  C-SSRS RISK CATEGORY No Risk No Risk No Risk    Psychiatric Specialty Exam  Presentation  General Appearance:Appropriate for Environment  Eye Contact:Fair  Speech:Clear and Coherent  Speech Volume:Normal  Handedness:Right   Mood and Affect  Mood:Euthymic  Affect:Congruent   Thought Process  Thought Processes:Coherent  Descriptions of Associations:Intact  Orientation:Full (Time, Place and Person)  Thought Content:Logical    Hallucinations:None  Ideas of Reference:No data recorded Suicidal Thoughts:No  Homicidal Thoughts:No   Sensorium  Memory:Immediate Fair  Judgment:Fair  Insight:Fair   Executive Functions  Concentration:Fair  Attention Span:Fair  Recall:Fair  Fund of Knowledge:Fair  Language:Fair   Psychomotor Activity  Psychomotor Activity:Normal   Assets  Assets:Resilience   Sleep  Sleep:Good  Number of hours: No data  recorded  Physical Exam: Physical Exam Constitutional:      Appearance: Normal appearance.  Musculoskeletal:      Cervical back: Normal range of motion.  Neurological:     General: No focal deficit present.     Mental Status: She is alert and oriented to person, place, and time.  Psychiatric:        Mood and Affect: Mood normal.        Behavior: Behavior normal.        Thought Content: Thought content normal.        Judgment: Judgment normal.    Review of Systems  Psychiatric/Behavioral:  Positive for depression and substance abuse. Negative for hallucinations, memory loss and suicidal ideas. The patient is nervous/anxious and has insomnia.   All other systems reviewed and are negative.  There were no vitals taken for this visit. There is no height or weight on file to calculate BMI.  Musculoskeletal: Strength & Muscle Tone: within normal limits Gait & Station: normal Patient leans: N/A  [facility] MSE Discharge Disposition for Follow up and Recommendations: Based on my evaluation the patient does not appear to have an emergency medical condition and can be discharged with resources and follow up care in outpatient services for Medication Management and Individual Therapy  Donia Snell, NP 05/30/2024, 10:31 AM

## 2024-05-30 NOTE — Discharge Instructions (Signed)
 Follow up with [facility] Residents Only  Walk-in hours for open access (medication management and therapy) are Monday - Friday 8 am to 11 am. Appointments are limited, so please arrive at 06:45am. Upon arrival, please complete the form on the clipboard located at the front desk. If there are no clipboards available, all appointments have been filled for that day.  [facility] [address] [place]  [address] [phone]   Get help right away if: You have thoughts about hurting yourself or others. Get help right away if you feel like you may hurt yourself or others, or have thoughts about taking your own life. Go to your nearest emergency room or: Call 911. Call the National Suicide Prevention Lifeline at 236-661-8739 or 988 in the [place] This is open 24 hours a day. If you're a Veteran: Call 988 and press 1. This is open 24 hours a day. Text the [employer] at 204-088-8199. Summary Mental health is not just the absence of mental illness. It involves understanding your emotions and behaviors, and taking steps to manage them in a healthy way. If you have symptoms of mental or emotional distress, get help from family, friends, a health care provider, or a mental health professional. Practice good mental health behaviors such as stress management skills, self-calming skills, exercise, healthy sleeping and eating, and supportive relationships. This information is not intended to replace advice given to you by your health care provider. Make sure you discuss any questions you have with your health care provider.    Education provided on the fact that if experiencing worsening of psychiatry symptoms including suicidal ideations, homicidal ideations, or having auditory/visual hallucinations, etc, to call 911, 988, come back to this location, or go to the nearest ER. Pt verbalized understanding.

## 2024-05-30 NOTE — Progress Notes (Signed)
   05/30/24 0829  [facility] Triage Screening (Walk-ins at [facility] only)  How Did You Hear About Us ? Self  What Is the Reason for Your Visit/Call Today? Pt presents to [facility] voluntarily and accompanied at this time with complaints of anxiety/panic attacks.  Pt denies SI, HI or AVH.  Pt reports sadness due to custody battle with exhusband.  Pt reports drinking two glasses of wine daily.  Pt reports prior MH diagnosis or prescribed medication for symptom managment.  How Long Has This Been Causing You Problems? 1 wk - 1 month  Have You Recently Had Any Thoughts About Hurting Yourself? No  Are You Planning to Commit Suicide/Harm Yourself At This time? No  Have you Recently Had Thoughts About Hurting Someone Sherral? No  Are You Planning To Harm Someone At This Time? No  Physical Abuse Denies  Verbal Abuse Denies  Sexual Abuse Denies  Exploitation of patient/patient's resources Denies  Self-Neglect Denies  Possible abuse reported to: Other (Comment) (N/A)  Are you currently experiencing any auditory, visual or other hallucinations? No  Have You Used Any Alcohol or Drugs in the Past 24 Hours? No  Do you have any current medical co-morbidities that require immediate attention? No  Clinician description of patient physical appearance/behavior: anxious  What Do You Feel Would Help You the Most Today? Treatment for Depression or other mood problem  If access to [facility] was not available, would you have sought care in the Emergency Department? Yes  Determination of Need Routine (7 days)  Options For Referral Medication Management;Outpatient Therapy

## 2024-09-03 ENCOUNTER — Other Ambulatory Visit: Payer: Self-pay

## 2024-09-03 ENCOUNTER — Emergency Department (HOSPITAL_BASED_OUTPATIENT_CLINIC_OR_DEPARTMENT_OTHER)
Admission: EM | Admit: 2024-09-03 | Discharge: 2024-09-04 | Disposition: A | Discharge status: Home / Self Care | Attending: Emergency Medicine | Admitting: Emergency Medicine

## 2024-09-03 ENCOUNTER — Emergency Department (HOSPITAL_BASED_OUTPATIENT_CLINIC_OR_DEPARTMENT_OTHER): Admitting: Radiology

## 2024-09-03 DIAGNOSIS — R739 Hyperglycemia, unspecified: Secondary | ICD-10-CM | POA: Insufficient documentation

## 2024-09-03 DIAGNOSIS — R0602 Shortness of breath: Secondary | ICD-10-CM | POA: Diagnosis present

## 2024-09-03 LAB — BASIC METABOLIC PANEL WITH GFR
Anion gap: 13 (ref 5–15)
BUN: 14 mg/dL (ref 6–20)
CO2: 24 mmol/L (ref 22–32)
Calcium: 9.3 mg/dL (ref 8.9–10.3)
Chloride: 103 mmol/L (ref 98–111)
Creatinine, Ser: 0.81 mg/dL (ref 0.44–1.00)
GFR, Estimated: >60 mL/min (ref >60)
Glucose, Bld: 116 mg/dL — ABNORMAL HIGH (ref 70–99)
Potassium: 3.9 mmol/L (ref 3.5–5.1)
Sodium: 140 mmol/L (ref 135–145)

## 2024-09-03 LAB — CBC
HCT: 35.2 % — ABNORMAL LOW (ref 36.0–46.0)
Hemoglobin: 12.4 g/dL (ref 12.0–15.0)
MCH: 34 pg (ref 26.0–34.0)
MCHC: 35.2 g/dL (ref 30.0–36.0)
MCV: 96.4 fL (ref 80.0–100.0)
Platelets: 264 K/uL (ref 150–400)
RBC: 3.65 MIL/uL — ABNORMAL LOW (ref 3.87–5.11)
RDW: 12.6 % (ref 11.5–15.5)
WBC: 8.8 K/uL (ref 4.0–10.5)
nRBC: 0 % (ref 0.0–0.2)

## 2024-09-03 MED ORDER — IPRATROPIUM-ALBUTEROL 0.5-2.5 (3) MG/3ML IN SOLN
3.0000 mL | Freq: Once | RESPIRATORY_TRACT | Status: AC
  Administered 2024-09-04: 3 mL via RESPIRATORY_TRACT
  Filled 2024-09-03: qty 3

## 2024-09-03 NOTE — Telephone Encounter (Signed)
 Pt is caller. Limited hx reviewed per EMR.   Medical hx: None Meds: Albuterol Inhaler 4-5 puffs every couple hours for the past 4 days, Tesslon Perls Allergies: none  Reason for Conversation: Respiratory Distress   Current Symptoms: can't catch her breath, dry cough   Home Care Tried & If Effective: Cold air helps a little   Denies:   Advised ED per protocol with 911 precautions and pt v/u.   Reason for Disposition .  [1] MODERATE difficulty breathing (e.g., speaks in phrases, SOB even at rest, pulse 100-120) AND [2] still present when not coughing   Disposition: Go to ED Now  .  [1] MODERATE difficulty breathing (e.g., speaks in phrases, SOB even at rest, pulse 100-120) AND [2] still present when not coughing   Protocols Used: Cough - Acute Non-Productive-Adult-AH  No Initial Assessment on file.

## 2024-09-03 NOTE — ED Triage Notes (Signed)
 Pt POV reporting cough and SOB x4 days, using family member's inhaler without improvement.

## 2024-09-03 NOTE — Telephone Encounter (Signed)
 Copied from CRM #[id number]. Topic: Clinical Concerns - High Priority Key Word >> Sep 03, 2024  9:24 PM Tawni CROME wrote: HPKW call;  Sent high priority message to Triage Pool. Informed the patient that someone will call them back and this call may come from a blocked number. [facility] -  Pcp, No >> Sep 03, 2024  9:25 PM Tawni CROME wrote: CLAUDELL, RHODY is calling for clinical concerns (Ask: What symptoms are you calling about today, AND how long have you had these symptoms? Must Review HPKW list for symptoms) Document Name of Triage Nurse/BH Rep taking the call when applicable)   Include all details related to the request(s) below: Having trouble breathing and was transferred to Physician call back line    Confirm and type the Best Contact Number below:  Patient/caller contact number:          [phone]   [] Home  [x] Mobile  [] Work  []  Other   [x]  Okay to leave a voicemail   Medication List:  Current Outpatient Medications:  .  dextroamphetamine-amphetamine (ADDERALL) 5 mg tablet, Take 1 tablet by mouth 2 (two) times a day. (Patient not taking: Reported on 08/15/2024), Disp: , Rfl:  .  hydrOXYzine  (ATARAX ) 25 mg tablet, Take 1 tablet (25 mg total) by mouth 3 (three) times a day as needed for itching or allergies., Disp: 45 tablet, Rfl: 0 .  triamcinolone acetonide (KENALOG) 0.1 % ointment, Apply topically 2 (two) times a day., Disp: 80 g, Rfl: 0     Medication Request/Refills: Pharmacy Information (if applicable)   [x]  Not Applicable       []  Pharmacy listed  Send Medication Request to:                                                 []  Pharmacy not listed (added to pharmacy list in Epic) Send Medication Request to:      Listed Pharmacies: [id number] [place], [place] - [address] - PHONE: [phone] - FAX: [phone] [id number] [place], [place] - [address] AT [facility] OF [address] - PHONE: [phone] - FAX:  [phone]

## 2024-09-03 NOTE — ED Provider Notes (Signed)
 [place] EMERGENCY DEPARTMENT AT [facility] Provider Note   CSN: [id number] Arrival date & time: 09/03/24  2211     Patient presents with: Shortness of Breath   Connie Reed is a 43 y.o. female.  {Add pertinent medical, surgical, social history, OB history to YEP:67052} The history is provided by the patient.  Shortness of Breath  She has history of attention deficit disorder, seizures and comes in because of shortness of breath for the last 3 days.  She denies chest pain.  There has been a nonproductive cough.  She notes that she is more short of breath when she is in her home and is concerned that there is mold there.  She has been using an inhaler 8 puffs at a time every 2 hours with only slight relief.  She has also been taking benzonatate without relief.    Prior to Admission medications   Medication Sig Start Date End Date Taking? Authorizing Provider  acetaminophen  (TYLENOL ) 500 MG tablet Take 500 mg by mouth every 6 (six) hours as needed.    [provider]  hydrOXYzine  (ATARAX ) 25 MG tablet Take 1 tablet (25 mg total) by mouth 3 (three) times daily as needed. 05/30/24   Tex Drilling, NP    Allergies: Gadolinium derivatives, Doxycycline hyclate, and Phenergan [promethazine hcl]    Review of Systems  Respiratory:  Positive for shortness of breath.   All other systems reviewed and are negative.   Updated Vital Signs BP (!) 140/78 (BP Location: Right Arm)   Pulse (!) 109   Temp (!) 97.5 F (36.4 C)   Resp 19   Ht 5' 4 (1.626 m)   Wt 81.6 kg   SpO2 99%   BMI 30.90 kg/m   Physical Exam Vitals reviewed.   43 year old female, resting comfortably and in no acute distress. Vital signs are significant for elevated heart rate. Oxygen saturation is 99%, which is normal. Head is normocephalic and atraumatic. PERRLA, EOMI.  Lungs are clear without rales, wheezes, or rhonchi.  There is a prolonged exhalation phase. Chest is nontender. Heart has  regular rate and rhythm without murmur. Abdomen is soft, flat, nontender. Extremities have no cyanosis or edema. Skin is warm and dry without rash. Neurologic: Mental status is normal, cranial nerves are intact, moves all extremities equally.  (all labs ordered are listed, but only abnormal results are displayed) Labs Reviewed  BASIC METABOLIC PANEL WITH GFR - Abnormal; Notable for the following components:      Result Value   Glucose, Bld 116 (*)    All other components within normal limits  CBC - Abnormal; Notable for the following components:   RBC 3.65 (*)    HCT 35.2 (*)    All other components within normal limits  PREGNANCY, URINE    EKG: EKG Interpretation Date/Time:  Sunday September 03 2024 22:27:22 EST Ventricular Rate:  102 PR Interval:  140 QRS Duration:  75 QT Interval:  347 QTC Calculation: 452 R Axis:   70  Text Interpretation: Sinus tachycardia Otherwise within normal limits When compared with ECG of 05/27/2021, No significant change was found Confirmed by Raford Lenis ([id number]) on 09/03/2024 11:41:08 PM  Radiology: [facility] Chest 2 View Result Date: 09/03/2024 EXAM: 2 VIEW(S) XRAY OF THE CHEST 09/03/2024 10:37:00 PM COMPARISON: 05/27/2021 CLINICAL HISTORY: SOB FINDINGS: LUNGS AND PLEURA: No focal pulmonary opacity. No pulmonary edema. No pleural effusion. No pneumothorax. HEART AND MEDIASTINUM: No acute abnormality of the cardiac and mediastinal silhouettes.  BONES AND SOFT TISSUES: No acute osseous abnormality. IMPRESSION: 1. No acute cardiopulmonary process. Electronically signed by: Pinkie Pebbles MD 09/03/2024 10:56 PM EST RP Workstation: [id number]    {Document cardiac monitor, telemetry assessment procedure when appropriate:32947} Procedures   Medications Ordered in the ED  ipratropium-albuterol (DUONEB) 0.5-2.5 (3) MG/3ML nebulizer solution 3 mL (has no administration in time range)      {Click here for ABCD2, HEART and other calculators REFRESH Note before  signing:1}                              Medical Decision Making Amount and/or Complexity of Data Reviewed Labs: ordered. Radiology: ordered.   Shortness of breath with some subtle findings suggestive of bronchospasm.  Differential diagnosis includes pneumonia, bronchitis, pulmonary embolism.  I have reviewed her electrocardiogram, my interpretation is sinus tachycardia but otherwise normal ECG.  Chest x-ray shows no acute cardiopulmonary process.  I have independently viewed the images, and agree with the radiologist's interpretation.  I have reviewed her laboratory tests, and my interpretation is normal CBC, elevated random glucose level which will need to be followed as an outpatient.  Because of tachycardia, pulmonary embolism cannot be ruled out by scoring protocols.  I have ordered a D-dimer.  I have ordered trial of albuterol with ipratropium via nebulizer.  {Document critical care time when appropriate  Document review of labs and clinical decision tools ie CHADS2VASC2, etc  Document your independent review of radiology images and any outside records  Document your discussion with family members, caretakers and with consultants  Document social determinants of health affecting pt's care  Document your decision making why or why not admission, treatments were needed:32947:::1}   Final diagnoses:  None    ED Discharge Orders     None

## 2024-09-04 LAB — D-DIMER, QUANTITATIVE: D-Dimer, Quant: 0.29 ug{FEU}/mL (ref 0.00–0.50)

## 2024-09-04 MED ORDER — ALBUTEROL SULFATE HFA 108 (90 BASE) MCG/ACT IN AERS
2.0000 | INHALATION_SPRAY | RESPIRATORY_TRACT | Status: DC | PRN
  Administered 2024-09-04: 2 via RESPIRATORY_TRACT
  Filled 2024-09-04: qty 6.7

## 2024-09-04 MED ORDER — PREDNISONE 50 MG PO TABS
60.0000 mg | ORAL_TABLET | Freq: Once | ORAL | Status: AC
  Administered 2024-09-04: 60 mg via ORAL
  Filled 2024-09-04: qty 1

## 2024-09-04 MED ORDER — ALBUTEROL SULFATE HFA 108 (90 BASE) MCG/ACT IN AERS: 2.0000 | INHALATION_SPRAY | RESPIRATORY_TRACT | 0 refills | Status: AC | PRN

## 2024-09-04 MED ORDER — PREDNISONE 50 MG PO TABS: 50.0000 mg | ORAL_TABLET | Freq: Every day | ORAL | 0 refills | Status: AC

## 2024-09-04 NOTE — Discharge Instructions (Addendum)
 Use the inhaler as directed.  If you have to use it more often or with more puffs than prescribed, then you need to be evaluated in the emergency department.  It would benefit you greatly if you were to stop smoking.

## 2024-09-04 NOTE — ED Notes (Signed)
 PT ambulated approx 100' with steady gait and tolerated well. O2 98%

## 2024-10-05 ENCOUNTER — Telehealth: Payer: Self-pay | Admitting: Nurse Practitioner

## 2024-10-05 NOTE — Telephone Encounter (Signed)
 Patient's mother is a patient of Dr. Justus and was informed per Dr. Jesus that he would take patient as a new patient. Can you confirm this?

## 2024-10-10 NOTE — Telephone Encounter (Signed)
 Patient is scheduled for NP appt 12/08/24

## 2024-11-14 ENCOUNTER — Encounter: Payer: Self-pay | Admitting: Diagnostic Neuroimaging

## 2024-11-14 ENCOUNTER — Ambulatory Visit: Admitting: Diagnostic Neuroimaging

## 2024-12-08 ENCOUNTER — Ambulatory Visit: Admitting: Internal Medicine
